# Patient Record
Sex: Male | Born: 1955 | Race: Black or African American | Hispanic: No | Marital: Single | Smoking: Former smoker
Health system: Southern US, Community
[De-identification: ages and names within clinical notes are randomized; demographics above are authoritative.]

## PROBLEM LIST (undated history)

## (undated) DIAGNOSIS — G43909 Migraine, unspecified, not intractable, without status migrainosus: Secondary | ICD-10-CM

## (undated) DIAGNOSIS — E663 Overweight: Secondary | ICD-10-CM

## (undated) DIAGNOSIS — N400 Enlarged prostate without lower urinary tract symptoms: Secondary | ICD-10-CM

## (undated) HISTORY — PX: BUNIONECTOMY: SHX129

## (undated) HISTORY — DX: Migraine, unspecified, not intractable, without status migrainosus: G43.909

## (undated) HISTORY — DX: Overweight: E66.3

## (undated) HISTORY — PX: OTHER SURGICAL HISTORY: SHX169

## (undated) HISTORY — PX: TONSILLECTOMY: SUR1361

---

## 2002-05-07 ENCOUNTER — Encounter: Payer: Self-pay | Admitting: Cardiology

## 2002-05-07 ENCOUNTER — Encounter: Admission: RE | Admit: 2002-05-07 | Discharge: 2002-05-07 | Payer: Self-pay | Admitting: Cardiology

## 2004-04-16 ENCOUNTER — Emergency Department: Payer: Self-pay | Admitting: Emergency Medicine

## 2004-07-07 ENCOUNTER — Ambulatory Visit (HOSPITAL_COMMUNITY): Admission: RE | Admit: 2004-07-07 | Discharge: 2004-07-07 | Payer: Self-pay | Admitting: Cardiology

## 2004-07-17 ENCOUNTER — Encounter: Admission: RE | Admit: 2004-07-17 | Discharge: 2004-07-17 | Payer: Self-pay | Admitting: Cardiology

## 2005-11-06 ENCOUNTER — Encounter: Admission: RE | Admit: 2005-11-06 | Discharge: 2005-11-06 | Payer: Self-pay | Admitting: Cardiology

## 2007-09-30 ENCOUNTER — Encounter: Admission: RE | Admit: 2007-09-30 | Discharge: 2007-09-30 | Payer: Self-pay | Admitting: Cardiology

## 2007-11-02 ENCOUNTER — Emergency Department: Payer: Self-pay | Admitting: Emergency Medicine

## 2009-08-30 ENCOUNTER — Ambulatory Visit (HOSPITAL_BASED_OUTPATIENT_CLINIC_OR_DEPARTMENT_OTHER): Admission: RE | Admit: 2009-08-30 | Discharge: 2009-08-30 | Payer: Self-pay | Admitting: Cardiology

## 2009-09-03 ENCOUNTER — Ambulatory Visit: Payer: Self-pay | Admitting: Internal Medicine

## 2011-08-17 ENCOUNTER — Inpatient Hospital Stay: Payer: Self-pay | Admitting: *Deleted

## 2011-08-17 LAB — COMPREHENSIVE METABOLIC PANEL
Albumin: 4.4 g/dL (ref 3.4–5.0)
BUN: 15 mg/dL (ref 7–18)
Bilirubin,Total: 0.7 mg/dL (ref 0.2–1.0)
Calcium, Total: 9.3 mg/dL (ref 8.5–10.1)
Co2: 23 mmol/L (ref 21–32)
SGPT (ALT): 41 U/L
Total Protein: 8.1 g/dL (ref 6.4–8.2)

## 2011-08-17 LAB — URINALYSIS, COMPLETE
Bilirubin,UR: NEGATIVE
Glucose,UR: NEGATIVE mg/dL (ref 0–75)
Ketone: NEGATIVE
Nitrite: NEGATIVE
Specific Gravity: 1.006 (ref 1.003–1.030)
WBC UR: <1 /HPF (ref 0–5)

## 2011-08-17 LAB — CBC WITH DIFFERENTIAL/PLATELET
Basophil #: 0 10*3/uL (ref 0.0–0.1)
Eosinophil %: 0.2 %
HCT: 43.5 % (ref 40.0–52.0)
Lymphocyte #: 0.5 10*3/uL — ABNORMAL LOW (ref 1.0–3.6)
Lymphocyte %: 4.6 %
MCH: 30.2 pg (ref 26.0–34.0)
MCHC: 33.6 g/dL (ref 32.0–36.0)
Neutrophil #: 9.5 10*3/uL — ABNORMAL HIGH (ref 1.4–6.5)
Neutrophil %: 87.3 %
Platelet: 178 10*3/uL (ref 150–440)
RDW: 14.2 % (ref 11.5–14.5)

## 2011-08-17 LAB — RAPID INFLUENZA A&B ANTIGENS

## 2011-08-18 LAB — COMPREHENSIVE METABOLIC PANEL
Albumin: 2.9 g/dL — ABNORMAL LOW (ref 3.4–5.0)
Alkaline Phosphatase: 56 U/L (ref 50–136)
Anion Gap: 11 (ref 7–16)
BUN: 13 mg/dL (ref 7–18)
Bilirubin,Total: 0.3 mg/dL (ref 0.2–1.0)
Calcium, Total: 7.8 mg/dL — ABNORMAL LOW (ref 8.5–10.1)
Co2: 21 mmol/L (ref 21–32)
Creatinine: 1.22 mg/dL (ref 0.60–1.30)
EGFR (African American): >60
Glucose: 86 mg/dL (ref 65–99)
Potassium: 3.5 mmol/L (ref 3.5–5.1)
SGOT(AST): 18 U/L (ref 15–37)
SGPT (ALT): 25 U/L
Sodium: 142 mmol/L (ref 136–145)
Total Protein: 6.2 g/dL — ABNORMAL LOW (ref 6.4–8.2)

## 2011-08-18 LAB — CBC WITH DIFFERENTIAL/PLATELET
Basophil #: 0 10*3/uL (ref 0.0–0.1)
Eosinophil #: 0 10*3/uL (ref 0.0–0.7)
Eosinophil %: 0.8 %
HCT: 36.9 % — ABNORMAL LOW (ref 40.0–52.0)
HGB: 12.4 g/dL — ABNORMAL LOW (ref 13.0–18.0)
Lymphocyte #: 0.8 10*3/uL — ABNORMAL LOW (ref 1.0–3.6)
Lymphocyte %: 12.6 %
MCH: 30.3 pg (ref 26.0–34.0)
Monocyte #: 0.7 10*3/uL (ref 0.0–0.7)
Monocyte %: 10.6 %
Platelet: 126 10*3/uL — ABNORMAL LOW (ref 150–440)
WBC: 6.4 10*3/uL (ref 3.8–10.6)

## 2011-08-22 LAB — CULTURE, BLOOD (SINGLE)

## 2011-08-24 LAB — CULTURE, BLOOD (SINGLE)

## 2012-02-25 ENCOUNTER — Other Ambulatory Visit: Payer: BC Managed Care – PPO | Admitting: Internal Medicine

## 2012-02-25 ENCOUNTER — Other Ambulatory Visit: Payer: Self-pay | Admitting: Internal Medicine

## 2012-02-25 DIAGNOSIS — Z0000 Encounter for general adult medical examination without abnormal findings: Secondary | ICD-10-CM

## 2012-02-25 DIAGNOSIS — Z125 Encounter for screening for malignant neoplasm of prostate: Secondary | ICD-10-CM

## 2012-02-25 LAB — CBC WITH DIFFERENTIAL/PLATELET
Basophils Absolute: 0 10*3/uL (ref 0.0–0.1)
Basophils Relative: 0 % (ref 0–1)
Eosinophils Absolute: 0.2 10*3/uL (ref 0.0–0.7)
Eosinophils Relative: 6 % — ABNORMAL HIGH (ref 0–5)
HCT: 39.7 % (ref 39.0–52.0)
Hemoglobin: 13.9 g/dL (ref 13.0–17.0)
Lymphocytes Relative: 37 % (ref 12–46)
Lymphs Abs: 1 10*3/uL (ref 0.7–4.0)
MCH: 30.2 pg (ref 26.0–34.0)
MCHC: 35 g/dL (ref 30.0–36.0)
MCV: 86.3 fL (ref 78.0–100.0)
Monocytes Absolute: 0.4 10*3/uL (ref 0.1–1.0)
Monocytes Relative: 13 % — ABNORMAL HIGH (ref 3–12)
Neutro Abs: 1.2 10*3/uL — ABNORMAL LOW (ref 1.7–7.7)
Neutrophils Relative %: 44 % (ref 43–77)
Platelets: 179 10*3/uL (ref 150–400)
RBC: 4.6 MIL/uL (ref 4.22–5.81)
RDW: 14 % (ref 11.5–15.5)
WBC: 2.8 10*3/uL — ABNORMAL LOW (ref 4.0–10.5)

## 2012-02-25 LAB — LIPID PANEL
Cholesterol: 196 mg/dL (ref 0–200)
HDL: 43 mg/dL (ref >39)
LDL Cholesterol: 141 mg/dL — ABNORMAL HIGH (ref 0–99)
Total CHOL/HDL Ratio: 4.6 Ratio
Triglycerides: 61 mg/dL (ref <150)
VLDL: 12 mg/dL (ref 0–40)

## 2012-02-25 LAB — COMPREHENSIVE METABOLIC PANEL
ALT: 22 U/L (ref 0–53)
AST: 22 U/L (ref 0–37)
Albumin: 4.2 g/dL (ref 3.5–5.2)
Alkaline Phosphatase: 51 U/L (ref 39–117)
BUN: 13 mg/dL (ref 6–23)
CO2: 26 mEq/L (ref 19–32)
Calcium: 9 mg/dL (ref 8.4–10.5)
Chloride: 104 mEq/L (ref 96–112)
Creat: 0.99 mg/dL (ref 0.50–1.35)
Glucose, Bld: 79 mg/dL (ref 70–99)
Potassium: 4.2 mEq/L (ref 3.5–5.3)
Sodium: 137 mEq/L (ref 135–145)
Total Bilirubin: 0.6 mg/dL (ref 0.3–1.2)
Total Protein: 6.6 g/dL (ref 6.0–8.3)

## 2012-02-26 ENCOUNTER — Encounter: Payer: Self-pay | Admitting: Internal Medicine

## 2012-02-26 ENCOUNTER — Ambulatory Visit (INDEPENDENT_AMBULATORY_CARE_PROVIDER_SITE_OTHER): Payer: BC Managed Care – PPO | Admitting: Internal Medicine

## 2012-02-26 VITALS — BP 120/68 | HR 80 | Temp 97.9°F | Ht 67.0 in | Wt 249.0 lb

## 2012-02-26 DIAGNOSIS — E785 Hyperlipidemia, unspecified: Secondary | ICD-10-CM

## 2012-02-26 DIAGNOSIS — Z0000 Encounter for general adult medical examination without abnormal findings: Secondary | ICD-10-CM

## 2012-02-26 DIAGNOSIS — Z8669 Personal history of other diseases of the nervous system and sense organs: Secondary | ICD-10-CM

## 2012-02-26 DIAGNOSIS — N400 Enlarged prostate without lower urinary tract symptoms: Secondary | ICD-10-CM

## 2012-02-26 DIAGNOSIS — Z23 Encounter for immunization: Secondary | ICD-10-CM

## 2012-02-26 LAB — POCT URINALYSIS DIPSTICK
Blood, UA: NEGATIVE
Glucose, UA: NEGATIVE
Nitrite, UA: NEGATIVE
Protein, UA: NEGATIVE
Spec Grav, UA: 1.02
Urobilinogen, UA: NEGATIVE

## 2012-02-26 LAB — PSA: PSA: 1.02 ng/mL (ref <=4.00)

## 2012-02-26 MED ORDER — TETANUS-DIPHTH-ACELL PERTUSSIS 5-2.5-18.5 LF-MCG/0.5 IM SUSP: 0.5000 mL | Freq: Once | INTRAMUSCULAR | Status: DC

## 2012-02-27 LAB — HIV ANTIBODY (ROUTINE TESTING W REFLEX): HIV: NONREACTIVE

## 2012-03-16 ENCOUNTER — Encounter: Payer: Self-pay | Admitting: Internal Medicine

## 2012-04-18 DIAGNOSIS — N400 Enlarged prostate without lower urinary tract symptoms: Secondary | ICD-10-CM | POA: Insufficient documentation

## 2012-04-18 DIAGNOSIS — Z8669 Personal history of other diseases of the nervous system and sense organs: Secondary | ICD-10-CM | POA: Insufficient documentation

## 2012-04-18 DIAGNOSIS — E785 Hyperlipidemia, unspecified: Secondary | ICD-10-CM | POA: Insufficient documentation

## 2012-04-18 NOTE — Patient Instructions (Addendum)
Watch diet and continue exercise. Repeat lipid panel in 6-12 months. Take Vicodin if migraine headache not relieved by Excedrin Migraine

## 2012-04-18 NOTE — Progress Notes (Signed)
  Subjective:    Patient ID: Nicholas Flores, male    DOB: [birth year], 56 y.o.   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 History of migraine headaches for which he takes Excedrin Migraine. He has no rescue medication at the present time other than Excedrin Migraine.  History of right bunionectomy in the mid 1990s by Dr. Charlsie Merles.  Is allergic to penicillin.  History irritable bowel syndrome and lactose intolerance.  Sees Dr. Brunilda Payor for BPH  Patient is single. Completed 4 years of college. Smokes an occasional cigar. He used to smoke a half pack of cigarettes daily for some 12 years but quit 12 years ago. Drinks red wine daily. Generally has 8 ounces a day and has done this for 20 years. He also works out in a jam 2 or 3 times a week. Does cardio activity and weight lifting.  Family history: Twin brother age 77 in good health. Mother age 74 with history of stroke.    Review of Systems  Constitutional: Negative.   HENT: Negative.   Eyes: Negative.   Respiratory: Negative.   Cardiovascular: Negative.   Gastrointestinal: Negative.   Genitourinary:       Nocturia  Musculoskeletal: Negative.   Neurological:       History of migraine headaches  Hematological: Negative.   Psychiatric/Behavioral: Negative.        Objective:   Physical Exam  Vitals reviewed. Constitutional: He is oriented to person, place, and time. He appears well-developed and well-nourished. No distress.  HENT:  Head: Normocephalic and atraumatic.  Right Ear: External ear normal.  Mouth/Throat: Oropharynx is clear and moist. No oropharyngeal exudate.  Eyes: Conjunctivae normal and EOM are normal. Pupils are equal, round, and reactive to light. Left eye exhibits no discharge. No scleral icterus.  Neck: Neck supple. No JVD present. No thyromegaly present.  Cardiovascular: Normal rate, regular rhythm, normal heart  sounds and intact distal pulses.   No murmur heard. Pulmonary/Chest: Effort normal and breath sounds normal. He has no wheezes. He has no rales.  Abdominal: Soft. Bowel sounds are normal. He exhibits no distension and no mass. There is no tenderness. There is no rebound and no guarding.  Genitourinary:       Deferred to urologist  Musculoskeletal: Normal range of motion. He exhibits no edema.  Lymphadenopathy:    He has no cervical adenopathy.  Neurological: He is alert and oriented to person, place, and time. He has normal reflexes. No cranial nerve deficit. Coordination normal.  Skin: Skin is warm and dry. No rash noted. He is not diaphoretic.  Psychiatric: He has a normal mood and affect. His behavior is normal. Judgment and thought content normal.          Assessment & Plan:   BPH-treated with Flomax  History of migraine headaches-treated with Excedrin Migraine  Elevated LDL cholesterol  Plan: Patient will be prescribed Vicodin 5/500 (#60) 1 by mouth every 6 hours when necessary headache not relieved with Excedrin Migraine. Patient is to return in one year or as needed.  Patient is to watch diet continue exercise regimen.

## 2012-08-26 ENCOUNTER — Other Ambulatory Visit: Payer: BC Managed Care – PPO | Admitting: Internal Medicine

## 2012-08-26 DIAGNOSIS — E785 Hyperlipidemia, unspecified: Secondary | ICD-10-CM

## 2012-08-26 LAB — LIPID PANEL
Cholesterol: 204 mg/dL — ABNORMAL HIGH (ref 0–200)
Total CHOL/HDL Ratio: 4.3 Ratio
VLDL: 10 mg/dL (ref 0–40)

## 2012-08-28 ENCOUNTER — Other Ambulatory Visit: Payer: BC Managed Care – PPO | Admitting: Internal Medicine

## 2012-08-29 ENCOUNTER — Ambulatory Visit (INDEPENDENT_AMBULATORY_CARE_PROVIDER_SITE_OTHER): Payer: BC Managed Care – PPO | Admitting: Internal Medicine

## 2012-08-29 ENCOUNTER — Encounter: Payer: Self-pay | Admitting: Internal Medicine

## 2012-08-29 VITALS — BP 120/82 | HR 80 | Temp 97.8°F | Wt 250.0 lb

## 2012-08-29 DIAGNOSIS — E785 Hyperlipidemia, unspecified: Secondary | ICD-10-CM

## 2012-09-14 NOTE — Patient Instructions (Addendum)
Continue low-fat diet. Try to exercise several times weekly. Return in 6 months.

## 2012-09-14 NOTE — Progress Notes (Signed)
  Subjective:    Patient ID: Nicholas Flores, male    DOB: 06-13-1955, 57 y.o.   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 Hyperlipidemia has been diet controlled. He drinks a couple glasses of red wine at night. He eats fairly well but doesn't get exercise very much. School is been very hectic and stressful do 2 days Ms. because of bad weather.    Review of Systems     Objective:   Physical Exam Neck is supple without JVD thyromegaly or carotid bruits. Chest clear to auscultation. Cardiac exam regular rate and rhythm normal S1 and S2. Extremities without edema.  Lipid panel shows total cholesterol to be 204 and previously was 196. LDL cholesterol is 146 and previously was 141.       Assessment & Plan:  Hyperlipidemia-no significant change in lipid panel since last visit.   Plan: Patient agrees to begin regular exercise regimen and continue low-fat diet. Return in 6 months for physical exam and fasting lab work.

## 2012-10-24 ENCOUNTER — Ambulatory Visit: Payer: BC Managed Care – PPO | Admitting: Internal Medicine

## 2013-03-02 ENCOUNTER — Other Ambulatory Visit: Payer: BC Managed Care – PPO | Admitting: Internal Medicine

## 2013-03-05 ENCOUNTER — Other Ambulatory Visit: Payer: BC Managed Care – PPO | Admitting: Internal Medicine

## 2013-03-05 DIAGNOSIS — N401 Enlarged prostate with lower urinary tract symptoms: Secondary | ICD-10-CM

## 2013-03-05 DIAGNOSIS — Z114 Encounter for screening for human immunodeficiency virus [HIV]: Secondary | ICD-10-CM

## 2013-03-05 DIAGNOSIS — Z0000 Encounter for general adult medical examination without abnormal findings: Secondary | ICD-10-CM

## 2013-03-05 DIAGNOSIS — E785 Hyperlipidemia, unspecified: Secondary | ICD-10-CM

## 2013-03-05 DIAGNOSIS — Z130 Encounter for screening for diseases of the blood and blood-forming organs and certain disorders involving the immune mechanism: Secondary | ICD-10-CM

## 2013-03-05 LAB — COMPREHENSIVE METABOLIC PANEL
ALT: 24 U/L (ref 0–53)
AST: 25 U/L (ref 0–37)
Albumin: 4.1 g/dL (ref 3.5–5.2)
Alkaline Phosphatase: 53 U/L (ref 39–117)
BUN: 14 mg/dL (ref 6–23)
Calcium: 9.5 mg/dL (ref 8.4–10.5)
Chloride: 105 mEq/L (ref 96–112)
Potassium: 4.4 mEq/L (ref 3.5–5.3)
Sodium: 139 mEq/L (ref 135–145)
Total Protein: 6.8 g/dL (ref 6.0–8.3)

## 2013-03-05 LAB — CBC WITH DIFFERENTIAL/PLATELET
Basophils Absolute: 0 10*3/uL (ref 0.0–0.1)
Basophils Relative: 1 % (ref 0–1)
Eosinophils Absolute: 0.2 10*3/uL (ref 0.0–0.7)
MCH: 30.7 pg (ref 26.0–34.0)
MCHC: 34.6 g/dL (ref 30.0–36.0)
Monocytes Relative: 12 % (ref 3–12)
Neutro Abs: 2 10*3/uL (ref 1.7–7.7)
Neutrophils Relative %: 54 % (ref 43–77)
Platelets: 183 10*3/uL (ref 150–400)
RDW: 14.3 % (ref 11.5–15.5)

## 2013-03-05 LAB — LIPID PANEL
Cholesterol: 194 mg/dL (ref 0–200)
Triglycerides: 55 mg/dL (ref <150)
VLDL: 11 mg/dL (ref 0–40)

## 2013-03-05 LAB — PSA: PSA: 1.27 ng/mL (ref <=4.00)

## 2013-03-06 ENCOUNTER — Encounter: Payer: Self-pay | Admitting: Internal Medicine

## 2013-03-06 ENCOUNTER — Ambulatory Visit (INDEPENDENT_AMBULATORY_CARE_PROVIDER_SITE_OTHER): Payer: BC Managed Care – PPO | Admitting: Internal Medicine

## 2013-03-06 VITALS — BP 110/76 | HR 88 | Temp 97.6°F | Ht 66.5 in | Wt 234.0 lb

## 2013-03-06 DIAGNOSIS — N400 Enlarged prostate without lower urinary tract symptoms: Secondary | ICD-10-CM

## 2013-03-06 DIAGNOSIS — Z8669 Personal history of other diseases of the nervous system and sense organs: Secondary | ICD-10-CM

## 2013-03-06 DIAGNOSIS — E7800 Pure hypercholesterolemia, unspecified: Secondary | ICD-10-CM

## 2013-03-06 DIAGNOSIS — Z0000 Encounter for general adult medical examination without abnormal findings: Secondary | ICD-10-CM

## 2013-03-06 LAB — POCT URINALYSIS DIPSTICK
Bilirubin, UA: NEGATIVE
Blood, UA: NEGATIVE
Glucose, UA: NEGATIVE
Nitrite, UA: NEGATIVE
Protein, UA: NEGATIVE
Spec Grav, UA: 1.015
Urobilinogen, UA: NEGATIVE
pH, UA: 5.5

## 2013-03-06 MED ORDER — MOMETASONE FUROATE 50 MCG/ACT NA SUSP: 2.0000 | Freq: Every day | NASAL | Status: DC | PRN

## 2013-03-06 MED ORDER — HYDROCODONE-ACETAMINOPHEN 5-500 MG PO TABS: 1.0000 | ORAL_TABLET | Freq: Four times a day (QID) | ORAL | Status: DC | PRN

## 2013-03-06 NOTE — Patient Instructions (Signed)
Congratulations on weight loss. Keep up the good work. Return in one year or as needed.

## 2013-07-16 NOTE — Progress Notes (Signed)
   Subjective:    Patient ID: Nicholas Flores, male    DOB: [birth year], 58 y.o.   MRN: [id number]  HPI 58 year old Black Male in today for health maintenance exam and evaluation of medical issues. He had influenza immunization through employment. He has history of BPH with nocturia treated with generic Flomax. History of migraine headaches for which he takes Excedrin Migraine. History of hyperlipidemia.  He is allergic to penicillin.  Past medical history: History of right bunionectomy in the mid 1990s by Dr. Dellia Nimsiegel. History of variable bowel syndrome and lactose intolerance. Sees Dr. Brunilda PayorNesi for BPH.  Social history: Patient is single. He completed 4 years of college. Smokes an occasional cigar. He used to smoke a half pack of cigarettes daily for some 12 years but quit 1 13 years ago. Drinks red wine daily. Generally has 8 ounces a day and has done this for 20 years. Tries to work out several times a week. 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  Family history: Mother living in her 4380s with history of stroke. Twin brother in good health.    Review of Systems  Constitutional: Negative.   Endocrine:       History of hyperlipidemia  Neurological:       History of migraine headaches  All other systems reviewed and are negative.       Objective:   Physical Exam  Vitals reviewed. Constitutional: He is oriented to person, place, and time. He appears well-developed and well-nourished.  HENT:  Head: Normocephalic and atraumatic.  Right Ear: External ear normal.  Left Ear: External ear normal.  Mouth/Throat: Oropharynx is clear and moist. No oropharyngeal exudate.  Eyes: Conjunctivae and EOM are normal. Right eye exhibits no discharge. Left eye exhibits no discharge. No scleral icterus.  Neck: Neck supple. No JVD present. No thyromegaly present.  Cardiovascular: Normal rate, regular rhythm, normal heart sounds and intact distal pulses.   No murmur  heard. Pulmonary/Chest: Effort normal and breath sounds normal. No respiratory distress. He has no wheezes. He has no rales. He exhibits no tenderness.  Abdominal: Soft. Bowel sounds are normal. He exhibits no distension and no mass. There is no tenderness. There is no rebound and no guarding.  Genitourinary:  Per Dr. Brunilda PayorNesi  Musculoskeletal: He exhibits no edema.  Lymphadenopathy:    He has no cervical adenopathy.  Neurological: He is alert and oriented to person, place, and time. He has normal reflexes. No cranial nerve deficit. Coordination normal.  Skin: Skin is warm and dry. No rash noted.  Psychiatric: He has a normal mood and affect. His behavior is normal. Judgment and thought content normal.          Assessment & Plan:  BPH treated with Flomax per Dr. Brunilda PayorNesi  History of migraine headaches- treated with Excedrin Migraine  History of elevated LDL cholesterol-improved with diet and exercise. LDL cholesterol has decreased from 146-133. Continue to monitor.  Plan: Return in one year or as needed. Continue diet and exercise efforts.

## 2013-09-18 ENCOUNTER — Ambulatory Visit (INDEPENDENT_AMBULATORY_CARE_PROVIDER_SITE_OTHER): Payer: BC Managed Care – PPO | Admitting: Podiatry

## 2013-09-18 ENCOUNTER — Encounter: Payer: Self-pay | Admitting: Podiatry

## 2013-09-18 VITALS — BP 116/82 | HR 68 | Resp 18 | Ht 69.0 in | Wt 237.0 lb

## 2013-09-18 DIAGNOSIS — B351 Tinea unguium: Secondary | ICD-10-CM

## 2013-09-18 DIAGNOSIS — M79609 Pain in unspecified limb: Secondary | ICD-10-CM

## 2013-09-18 LAB — CBC WITH DIFFERENTIAL/PLATELET
BASOS ABS: 0 10*3/uL (ref 0.0–0.2)
Basos: 1 %
EOS: 4 %
Eosinophils Absolute: 0.1 10*3/uL (ref 0.0–0.4)
HEMATOCRIT: 41.1 % (ref 37.5–51.0)
Hemoglobin: 14.4 g/dL (ref 12.6–17.7)
IMMATURE GRANS (ABS): 0 10*3/uL (ref 0.0–0.1)
IMMATURE GRANULOCYTES: 0 %
Lymphocytes Absolute: 1.4 10*3/uL (ref 0.7–3.1)
Lymphs: 38 %
MCH: 30 pg (ref 26.6–33.0)
MCHC: 35 g/dL (ref 31.5–35.7)
MCV: 86 fL (ref 79–97)
MONOCYTES: 14 %
Monocytes Absolute: 0.5 10*3/uL (ref 0.1–0.9)
NEUTROS PCT: 43 %
Neutrophils Absolute: 1.7 10*3/uL (ref 1.4–7.0)
RBC: 4.8 x10E6/uL (ref 4.14–5.80)
RDW: 13.5 % (ref 12.3–15.4)
WBC: 3.7 10*3/uL (ref 3.4–10.8)

## 2013-09-18 MED ORDER — TERBINAFINE HCL 250 MG PO TABS: 250.0000 mg | ORAL_TABLET | Freq: Every day | ORAL | Status: DC

## 2013-09-18 NOTE — Progress Notes (Signed)
Subjective:     Patient ID: Nicholas Flores, male   DOB: 01-11-1956, 58 y.o.   MRN: [id number]  HPI patient presents stating I have thickness of all of my nails and I'm hoping something can be done for this. States it's been present for a number of years but it seems to have gotten worse recently   Review of Systems  All other systems reviewed and are negative.      Objective:   Physical Exam  Nursing note and vitals reviewed. Constitutional: He is oriented to person, place, and time.  Cardiovascular: Intact distal pulses.   Musculoskeletal: Normal range of motion.  Neurological: He is oriented to person, place, and time.  Skin: Skin is warm.   neurovascular status intact with muscle strength adequate and no equinus condition noted. 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 He wants aggressive conservative care and I have recommended a combined approach of Lamisil 250 mg for 90 days topical formulas 3 and laser here it patient wants this treatment and is scheduled for laser and we'll begin oral Lamisil and was sent for liver function study

## 2013-09-18 NOTE — Progress Notes (Signed)
   Subjective:    Patient ID: Nicholas Flores, male    DOB: [birth year], 58 y.o.   MRN: [id number]  HPI Comments: Would like to see about possible laser treatment . Thick discolored toenails on all the toenails. They have been like this for a while      Review of Systems  All other systems reviewed and are negative.      Objective:   Physical Exam        Assessment & Plan:

## 2013-09-19 LAB — HEPATIC FUNCTION PANEL
ALT: 28 IU/L (ref 0–44)
AST: 23 IU/L (ref 0–40)
Albumin: 4.5 g/dL (ref 3.5–5.5)
Alkaline Phosphatase: 56 IU/L (ref 39–117)
BILIRUBIN TOTAL: 0.4 mg/dL (ref 0.0–1.2)
Bilirubin, Direct: 0.13 mg/dL (ref 0.00–0.40)
TOTAL PROTEIN: 7.1 g/dL (ref 6.0–8.5)

## 2013-09-21 ENCOUNTER — Other Ambulatory Visit: Payer: Self-pay | Admitting: *Deleted

## 2013-09-21 MED ORDER — TERBINAFINE HCL 250 MG PO TABS
250.0000 mg | ORAL_TABLET | Freq: Every day | ORAL | Status: DC
Start: 2013-09-21 — End: 2014-04-23

## 2013-09-21 NOTE — Telephone Encounter (Signed)
SENT RX FOR LAMISIL TO [facility] [address]. PT HAD [facility] [address] LISTED TO SEND THIS RX TO. REQUEST PRIOR AUTH. PT AWARE OF THIS.

## 2013-09-28 ENCOUNTER — Ambulatory Visit: Payer: BC Managed Care – PPO | Admitting: Podiatry

## 2013-09-28 ENCOUNTER — Encounter: Payer: Self-pay | Admitting: Podiatry

## 2013-09-28 VITALS — BP 124/77 | HR 77 | Resp 16

## 2013-09-28 DIAGNOSIS — B351 Tinea unguium: Secondary | ICD-10-CM

## 2013-09-28 NOTE — Patient Instructions (Signed)

## 2013-09-28 NOTE — Progress Notes (Signed)
Subjective:     Patient ID: Nicholas Flores, male   DOB: Aug 29, 1955, 58 y.o.   MRN: [id number]  HPI patient presents for laser of all 10 toenails having chronic mycotic disease for years   Review of Systems     Objective:   Physical Exam Neurovascular status intact with thick brittle nailbeds 1-5 both feet    Assessment:     Mycotic nail infection with pain 1-5 both feet    Plan:     Laser of all 10 toenails approximately 2500 shocks performed

## 2013-11-16 ENCOUNTER — Ambulatory Visit: Payer: BC Managed Care – PPO | Admitting: Podiatry

## 2013-11-16 ENCOUNTER — Encounter: Payer: Self-pay | Admitting: Podiatry

## 2013-11-16 DIAGNOSIS — B351 Tinea unguium: Secondary | ICD-10-CM

## 2013-11-16 NOTE — Progress Notes (Signed)
Subjective:     Patient ID: Nicholas Flores, male   DOB: 03/03/1956, 58 y.o.   MRN: [id number]  HPI patient presents for laser #2 states they're improving   Review of Systems     Objective:   Physical Exam Neurovascular status intact with improving nailbeds 1-5 of both feet    Assessment:     Improving mycosis of both feet    Plan:     Laser approximately 2000 pulses applied to nails one through 5 of both feet

## 2014-03-05 ENCOUNTER — Other Ambulatory Visit: Payer: BC Managed Care – PPO | Admitting: Internal Medicine

## 2014-03-08 ENCOUNTER — Encounter: Payer: BC Managed Care – PPO | Admitting: Internal Medicine

## 2014-03-25 ENCOUNTER — Other Ambulatory Visit: Payer: BC Managed Care – PPO | Admitting: Internal Medicine

## 2014-03-26 ENCOUNTER — Encounter: Payer: BC Managed Care – PPO | Admitting: Internal Medicine

## 2014-04-19 ENCOUNTER — Ambulatory Visit: Payer: BC Managed Care – PPO | Admitting: Podiatry

## 2014-04-22 ENCOUNTER — Other Ambulatory Visit: Payer: BC Managed Care – PPO | Admitting: Internal Medicine

## 2014-04-22 DIAGNOSIS — Z130 Encounter for screening for diseases of the blood and blood-forming organs and certain disorders involving the immune mechanism: Secondary | ICD-10-CM

## 2014-04-22 DIAGNOSIS — Z0000 Encounter for general adult medical examination without abnormal findings: Secondary | ICD-10-CM

## 2014-04-22 DIAGNOSIS — Z125 Encounter for screening for malignant neoplasm of prostate: Secondary | ICD-10-CM

## 2014-04-22 DIAGNOSIS — Z13220 Encounter for screening for lipoid disorders: Secondary | ICD-10-CM

## 2014-04-22 LAB — CBC WITH DIFFERENTIAL/PLATELET
Basophils Absolute: 0 10*3/uL (ref 0.0–0.1)
Basophils Relative: 1 % (ref 0–1)
Eosinophils Absolute: 0.2 10*3/uL (ref 0.0–0.7)
Eosinophils Relative: 4 % (ref 0–5)
HCT: 39.8 % (ref 39.0–52.0)
Hemoglobin: 13.6 g/dL (ref 13.0–17.0)
Lymphocytes Relative: 31 % (ref 12–46)
Lymphs Abs: 1.3 10*3/uL (ref 0.7–4.0)
MCH: 29.4 pg (ref 26.0–34.0)
MCHC: 34.2 g/dL (ref 30.0–36.0)
MCV: 86 fL (ref 78.0–100.0)
MPV: 9.1 fL — ABNORMAL LOW (ref 9.4–12.4)
Monocytes Absolute: 0.5 10*3/uL (ref 0.1–1.0)
Monocytes Relative: 12 % (ref 3–12)
Neutro Abs: 2.2 10*3/uL (ref 1.7–7.7)
Neutrophils Relative %: 52 % (ref 43–77)
Platelets: 200 10*3/uL (ref 150–400)
RBC: 4.63 MIL/uL (ref 4.22–5.81)
RDW: 14.6 % (ref 11.5–15.5)
WBC: 4.3 10*3/uL (ref 4.0–10.5)

## 2014-04-22 LAB — LIPID PANEL
CHOLESTEROL: 213 mg/dL — AB (ref 0–200)
HDL: 55 mg/dL (ref >39)
LDL Cholesterol: 145 mg/dL — ABNORMAL HIGH (ref 0–99)
Total CHOL/HDL Ratio: 3.9 Ratio
Triglycerides: 63 mg/dL (ref <150)
VLDL: 13 mg/dL (ref 0–40)

## 2014-04-22 LAB — COMPREHENSIVE METABOLIC PANEL WITH GFR
ALT: 37 U/L (ref 0–53)
AST: 26 U/L (ref 0–37)
Albumin: 4 g/dL (ref 3.5–5.2)
Alkaline Phosphatase: 61 U/L (ref 39–117)
BUN: 15 mg/dL (ref 6–23)
CO2: 26 meq/L (ref 19–32)
Calcium: 9.3 mg/dL (ref 8.4–10.5)
Chloride: 104 meq/L (ref 96–112)
Creat: 1.2 mg/dL (ref 0.50–1.35)
Glucose, Bld: 91 mg/dL (ref 70–99)
Potassium: 4.6 meq/L (ref 3.5–5.3)
Sodium: 139 meq/L (ref 135–145)
Total Bilirubin: 0.4 mg/dL (ref 0.2–1.2)
Total Protein: 7.1 g/dL (ref 6.0–8.3)

## 2014-04-23 ENCOUNTER — Ambulatory Visit (INDEPENDENT_AMBULATORY_CARE_PROVIDER_SITE_OTHER): Payer: BC Managed Care – PPO | Admitting: Internal Medicine

## 2014-04-23 ENCOUNTER — Encounter: Payer: Self-pay | Admitting: Internal Medicine

## 2014-04-23 VITALS — BP 104/78 | HR 91 | Temp 98.6°F | Ht 69.0 in | Wt 263.0 lb

## 2014-04-23 DIAGNOSIS — N400 Enlarged prostate without lower urinary tract symptoms: Secondary | ICD-10-CM | POA: Diagnosis not present

## 2014-04-23 DIAGNOSIS — Z8669 Personal history of other diseases of the nervous system and sense organs: Secondary | ICD-10-CM

## 2014-04-23 DIAGNOSIS — Z0000 Encounter for general adult medical examination without abnormal findings: Secondary | ICD-10-CM

## 2014-04-23 DIAGNOSIS — E785 Hyperlipidemia, unspecified: Secondary | ICD-10-CM

## 2014-04-23 DIAGNOSIS — M2390 Unspecified internal derangement of unspecified knee: Secondary | ICD-10-CM | POA: Diagnosis not present

## 2014-04-23 LAB — POCT URINALYSIS DIPSTICK
Glucose, UA: NEGATIVE
KETONES UA: NEGATIVE
LEUKOCYTES UA: NEGATIVE
Nitrite, UA: NEGATIVE
PROTEIN UA: NEGATIVE
SPEC GRAV UA: 1.015
Urobilinogen, UA: 0.2
pH, UA: 6

## 2014-04-23 LAB — PSA: PSA: 1.09 ng/mL (ref <=4.00)

## 2014-04-26 ENCOUNTER — Ambulatory Visit (INDEPENDENT_AMBULATORY_CARE_PROVIDER_SITE_OTHER): Payer: BC Managed Care – PPO | Admitting: Podiatry

## 2014-04-26 ENCOUNTER — Encounter: Payer: Self-pay | Admitting: Podiatry

## 2014-04-26 DIAGNOSIS — B351 Tinea unguium: Secondary | ICD-10-CM

## 2014-04-26 NOTE — Progress Notes (Signed)
Subjective:     Patient ID: Nicholas Flores, male   DOB: Mar 02, 1956, 58 y.o.   MRN: [id number]  HPI patient presents stating I'm here to have my nails lasered they are improving   Review of Systems     Objective:   Physical Exam Neurovascular status intact with improvement of nailbeds 1-5 both feet with thickness still noted but not as significant as previously    Assessment:     Improving mycotic nail infections 1 through 5 both feet    Plan:     Laser accomplished today to all 10 nails tolerated well approximate 2000 pulses. We will do 30 days of Lamisil in the spring and we'll contact us at that time

## 2014-04-26 NOTE — Patient Instructions (Signed)

## 2014-07-01 DIAGNOSIS — S76119A Strain of unspecified quadriceps muscle, fascia and tendon, initial encounter: Secondary | ICD-10-CM | POA: Insufficient documentation

## 2014-07-18 NOTE — Progress Notes (Signed)
   Subjective:    Patient ID: Nicholas Flores, male    DOB: 12/22/1955, 59 y.o.   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 While at a hair show  in [place] in October, he had a terrible fall in a parking lot and injured his knee. He continues to have knee pain. He has an appointment to be seen in [place] regarding this issue. Receives flu vaccine at work. History of migraine headaches treated with Excedrin Migraine. History of LDL cholesterol. History of BPH treated with Flomax per Dr. Harriett SineNancy.  He is allergic to penicillin.  Past medical history: Right bunionectomy in the mid 1990s by Dr. Charlsie Merlesegal. History of lactose intolerance and irritable bowel syndrome.  Social history: Patient completed 4 years of college. He's single. Smokes an occasional cigar. He used to smoke a half pack of cigarettes daily for some 12 years but quit about 15 years ago. Drinks red wine daily. Generally has 8 ounces a day and is done this for about 20 years. Tries to work out several times a week but has not been able to work out with knee injury.  Family history: Mother living in her 10480s with history of stroke.Twin brother in good health.  Evaluated for chest pain May 2015 at [facility] [place]. See Care Everywhere    Review of Systems  Constitutional: Positive for fatigue.  Cardiovascular: Negative.   Gastrointestinal:       History of lactose intolerance and irritable bowel syndrome  Musculoskeletal:       Knee pain  Psychiatric/Behavioral:       History of migraine headaches       Objective:   Physical Exam  Constitutional: He appears well-developed and well-nourished. No distress.  HENT:  Head: Normocephalic and atraumatic.  Right Ear: External ear normal.  Left Ear: External ear normal.  Mouth/Throat: Oropharynx is clear and moist. No oropharyngeal exudate.  Eyes: Conjunctivae are normal. Pupils are  equal, round, and reactive to light. Right eye exhibits no discharge. Left eye exhibits no discharge. No scleral icterus.  Neck: No JVD present. No thyromegaly present.  Cardiovascular: Normal rate, regular rhythm and normal heart sounds.   No murmur heard. Pulmonary/Chest: Effort normal and breath sounds normal. He has no wheezes.  Abdominal: Soft. Bowel sounds are normal. He exhibits no distension and no mass. There is no tenderness. There is no rebound and no guarding.  Genitourinary: Prostate normal.  Musculoskeletal: He exhibits no edema.  Knee exam: Quadriceps inhibition. Joint line tenderness. Possible meniscal tear.  Lymphadenopathy:    He has no cervical adenopathy.  Skin: Skin is warm and dry. No rash noted. He is not diaphoretic.  Psychiatric: He has a normal mood and affect. His behavior is normal. Thought content normal.  Vitals reviewed.         Assessment & Plan:  Knee pain secondary to serious fall October 2015-possible torn meniscus  History of migraine headaches-treated with Excedrin Migraine  Hyperlipidemia-treated with diet  BPH treated with Flomax per Dr. Brunilda PayorNesi  Plan: [facility] in the near future.

## 2014-07-27 ENCOUNTER — Telehealth: Payer: Self-pay | Admitting: *Deleted

## 2014-07-27 NOTE — Telephone Encounter (Signed)
Patient wnts pain med for migraines he wants something we can call in he states due to recent surgery he cant come in to pick up a script . He does have pain medication from surgeon but its almost out. Explained to patient controlled medications have to be picked up office

## 2014-07-27 NOTE — Telephone Encounter (Signed)
Written Rx for Hydrocodone APAP 5/325 #30 with no refill one po q 8 hours prn migraine headache

## 2014-08-05 NOTE — Telephone Encounter (Signed)
Patient aware script is ready for pick up 

## 2014-08-14 ENCOUNTER — Encounter: Payer: Self-pay | Admitting: Internal Medicine

## 2014-08-14 NOTE — Patient Instructions (Signed)
Keep appointment with orthopedist. Continue same medications. Watch diet. Return in one year or as needed.

## 2014-09-12 NOTE — H&P (Signed)
PATIENT NAME:  Nicholas Flores, Nicholas Flores MR#:  [id number] DATE OF BIRTH:  05/24/1955  DATE OF ADMISSION:  08/17/2011  PRIMARY CARE PHYSICIAN: In [place], [place].   CHIEF COMPLAINT: "I had chills."   HISTORY OF PRESENT ILLNESS: A 59 year old male with history of migraines, history of benign prostatic hypertrophy. He said he went to church last night and he started having chills. He felt cold and he was having severe body aches. He went home. He was also complaining of shortness of breath at that time. He felt as if he is hyperventilating. He was okay throughout the day yesterday. He said he worked a normal day and then it just hit him last night. He denies any nausea, vomiting or abdominal pain. His throat was dry at that time. He is complaining of some headache over the forehead area. He says because he has migraines. He denies any neck stiffness or vision changes. He was extremely dizzy also when he walked to the stretcher to the EMS. When he presented to the Emergency Room he had a temperature of 100.8. He was tachycardic with a heart rate of 110. Initially his blood pressure was 146/70 but then his blood pressure dropped during the ER course. His blood pressure went down to 78 to 80 systolic. He got about 2 liters of normal saline bolus already but he was still hypotensive so hospitalist was asked to admit to the patient because of fever and hypotension. He denies any sick contacts. He denies taking any antibiotics at home. He denies any recent dental extraction or any intervention done. His initial work-up in the Emergency Room shows a slightly elevated white count of 10.9. His urinalysis is negative. Negative for influenza A and B. He got a dose of ceftriaxone IV and he got 500 mg of Levaquin p.o. He said his shortness of breath is better but he still feels sick so he is being admitted for fever of unknown origin along with hypotension.   REVIEW OF SYSTEMS: Positive for fever, chills, and weakness. No acute  change in vision. He is complaining of headache, some runny nose. He denies any sore throat now. No difficulty swallowing. No sinus pain. He denies any cough. He had some shortness of breath initially last night. No chest pain. No dyspnea on exertion. No palpitations. No nausea, vomiting, diarrhea, abdominal pain, gastrointestinal bleed. No dysuria. No frequency. No thyroid problems. No anemia. No rash. He is complaining of lower back pain. 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 No focal numbness or weakness. No anxiety, depression.    PAST MEDICAL HISTORY:  1. History of migraines. He used to take Topamax in the past for prophylaxis but he stopped that. 2. Benign prostatic hypertrophy.   PAST SURGICAL HISTORY:  1. Tonsillectomy. 2. Bunion surgery on the foot.    ALLERGIES: Penicillin.   HOME MEDICATIONS:  1. Flomax 0.8 mg a day. 2. Multivitamins with iron. 3. Omega-3 fatty acid 2 tablets once daily.   SOCIAL HISTORY: He works as an Production designer, theatre/television/film at [employer] in [place]. He occasionally smokes cigars. He drinks a glass of wine daily. Denies any drug use.   FAMILY HISTORY: Mother was diabetic in late 57s. Also history of stroke and heart disease in the family.   PHYSICAL EXAMINATION:  VITAL SIGNS: His vitals when he presented to the Emergency Room: Temperature 100.8, heart rate 110, respiratory rate 22, blood pressure 146/70, saturating 100% on room air. Currently, heart rate  84, blood pressure 97/61, saturating 100% on room air with respiratory rate 13.   GENERAL: This is a middle-aged [place] male, well built, obese, appears to be slightly toxic in appearance.   HEENT: Bilateral pupils are equal and reactive. Extraocular muscles are intact. No scleral icterus. No conjunctivitis. Oral mucosa is moist. No pallor. Throat no exudate.   NECK: No thyroid tenderness, enlargement or nodule. Neck is supple. No masses, nontender. No  adenopathy. No JVD. No carotid bruit. Neck is supple.   CHEST: He has bilaterally diminished breath sounds. He is diminished at the bases bilaterally. Normal respiratory effort. Not using accessory muscles of respiration.   HEART: Heart sounds are regular. No murmur. Good peripheral pulses. No lower extremity edema.   ABDOMEN: Soft, nontender. Normal bowel sounds. No hepatosplenomegaly. No bruits. No masses.   RECTAL: Deferred.   LYMPH: Could not appreciate any enlarged lymph nodes or tenderness in the groin area.   NEUROLOGIC: He is awake, alert, oriented to time, place, and person. Cranial nerves are intact. Moving all extremities against gravity.   EXTREMITIES: No cyanosis. No clubbing.   SKIN: No rash. No lesions.   LABORATORY, DIAGNOSTIC AND RADIOLOGICAL DATA: White count 10.9, hemoglobin 14.6, platelet count 178,000. He has 87.3% neutrophils. BMP: Sodium 138, potassium 3.9, BUN 15, creatinine 1.24, AST slightly elevated at 39. Urinalysis essentially negative, ketones negative, nitrite and leukocyte esterase negative. Influenza A and B negative. His EKG shows sinus tachycardia at 109. No acute ischemic changes.   IMPRESSION:  1. He presented with fever, tachycardia and hypotension,Systemic inflammatory response syndrome. It could be secondary to viral syndrome.  2. Hypotension.  3. Mild leukocytosis. 4. Benign prostatic hypertrophy.   PLAN: A 59 year old male who has history of benign prostatic hypertrophy, history of migraines. He presents with sudden onset of chills, fever, tachycardia and he is hypotensive. His urinalysis is negative. Influenza A and B is negative. He has slight leukocytosis with neutrophilic predominance. His blood cultures and urine cultures have been sent. His neck is supple. His chest x-rays may be having some opacities in the lower lung. He got a dose of ceftriaxone and Levaquin in the Emergency Room. Since he is allergic to penicillin I am going to continue  Levaquin on him empirically and wait for cultures to come back. Will also give him aggressive IV hydration. Considering his generalized body ache, fever, chills this could be viral syndrome also. Throat does not show any exudate. Beta strep culture has been sent already. Will admit him to observation.   TIME SPENT WITH ADMISSION AND COORDINATION: 50 minutes.    ____________________________ Fredia SorrowAbhinav Daylah Sayavong, MD ag:cms D: 08/17/2011 08:48:19 ET T: 08/17/2011 11:22:02 ET JOB#: [id number]  cc: Fredia SorrowAbhinav Chandelle Harkey, MD, <Dictator> Fredia SorrowABHINAV Jaymee Tilson MD ELECTRONICALLY SIGNED 09/10/2011 12:11

## 2014-09-12 NOTE — Discharge Summary (Signed)
PATIENT NAME:  Nicholas Flores, Nicholas Flores MR#:  [id number] DATE OF BIRTH:  [birth year]  DATE OF ADMISSION:  08/17/2011 DATE OF DISCHARGE:  08/19/2011  DISCHARGE DIAGNOSES:  1. Systemic inflammatory response syndrome, suspect secondary to streptococcus agalactiae bacteremia.  2. Leukocytosis, resolved. 3. Hypotension, resolved.  4. History of benign prostatic hypertrophy. 5. Migraine headache.   HOSPITAL COURSE: A 59 year old male with history of benign prostatic hypertrophy and migraine headaches. He presented with sudden onset of fever and chills. When he came to the Emergency Room his temperature was 100.8. He was tachycardic with a heart rate of 110. His initial blood pressure was okay and then his blood pressure dropped to 78 to 80 systolic in the Emergency Room. He also had elevated white count in the range of 10.9. He was admitted as systemic inflammatory response syndrome with no apparent source of infection. His chest x-ray when he came in was essentially negative. He had a urinalysis that was negative. He was negative for influenza A and B. His creatinine was normal at 1.25. AST slightly elevated at 39, essentially normal. LFTs were normal. He was started empirically on IV Levaquin. His blood cultures were sent. Anaerobic bottle grew Streptococcus agalactiae which was sensitive to levaquin, ampicillin. The other bottle was negative.. He had a throat beta strep sent that was negative. Initially he was started on vancomycin also because of gram-positive cocci but when we found out that it was streptococcus agalactiae sensitive to Levaquin, he was continued on Levaquin. He got three doses of Levaquin in the hospital. I am going to give him seven more days to complete 10 days of antibiotics. His repeat white count has normalized to 6.4. His creatinine is stable at 1.22. His AST has normalized to 18. He had no further fever. His blood pressure is stable. Because we could not find an apparent source of infection  echocardiogram was done which showed ejection fraction of 55%. No apparent source of vegetation. Mild to moderate TR. He also complained of some headache during the hospital stay, received some Fioricet but that has resolved right now. Advised to follow up with his primary care physician and then he needs follow up CBC and MET-B drawn. He complained of left leg pain last night but that has resolved right now. Will ambulate him before discharge. There does not appear to be any left lower extremity swelling.   DISCHARGE MEDICATIONS: His medications at discharge include his home medications which include:  1. Flomax 0.4 mg 2 capsules once daily.  2. Multivitamin with iron daily.  3. Omega-3 2 tablets daily.  4. New medication: Levaquin 500 mg p.o. once daily for seven days.   DIET: Regular diet.   CONDITION AT DISCHARGE: He is comfortable.   PHYSICAL EXAMINATION: VITAL SIGNS: T-max 98.6, heart rate 81, blood pressure 113/72 to 118/78, saturating 97% on room air. Well hydrated. Chest clear. Heart sounds are regular. Abdomen soft, nontender. No swelling in the left leg. Homans sign is negative. He is awake, alert, oriented x3.   FOLLOW UP: Patient should follow up with PMD in [place] in one week. Follow up CBC and BMP in one week at your PMDs office. If any worsening of leg pain or leg swelling then he should follow up with his primary care physician early. I have also provided him with a doctor's excuse for work.   TIME SPENT WITH DISCHARGE: 40 minutes.   ____________________________ Mena Pauls, MD ag:cms D: 08/19/2011 11:49:09 ET T: 08/20/2011 14:16:24 ET JOB#: [id number]  cc:  Mena Pauls, MD, <Dictator> Dr. Julieanne Cotton, Judith Part MD ELECTRONICALLY SIGNED 08/30/2011 17:21

## 2014-09-20 ENCOUNTER — Other Ambulatory Visit: Payer: Self-pay | Admitting: *Deleted

## 2014-09-20 MED ORDER — HYDROCODONE-ACETAMINOPHEN 5-325 MG PO TABS: 1.0000 | ORAL_TABLET | Freq: Four times a day (QID) | ORAL | Status: DC | PRN

## 2014-09-20 MED ORDER — HYDROCODONE-ACETAMINOPHEN 5-500 MG PO TABS: 1.0000 | ORAL_TABLET | Freq: Four times a day (QID) | ORAL | Status: DC | PRN

## 2014-09-20 NOTE — Telephone Encounter (Signed)
[facility] Lenord FellersBaxley

## 2014-10-17 ENCOUNTER — Emergency Department
Admission: EM | Admit: 2014-10-17 | Discharge: 2014-10-17 | Disposition: A | Discharge status: Home / Self Care | Payer: BC Managed Care – PPO | Attending: Student | Admitting: Student

## 2014-10-17 ENCOUNTER — Emergency Department: Payer: BC Managed Care – PPO

## 2014-10-17 ENCOUNTER — Encounter: Payer: Self-pay | Admitting: Emergency Medicine

## 2014-10-17 DIAGNOSIS — R600 Localized edema: Secondary | ICD-10-CM | POA: Diagnosis not present

## 2014-10-17 DIAGNOSIS — M79671 Pain in right foot: Secondary | ICD-10-CM

## 2014-10-17 DIAGNOSIS — Z7982 Long term (current) use of aspirin: Secondary | ICD-10-CM | POA: Diagnosis not present

## 2014-10-17 DIAGNOSIS — Z880 Allergy status to penicillin: Secondary | ICD-10-CM | POA: Insufficient documentation

## 2014-10-17 DIAGNOSIS — R2241 Localized swelling, mass and lump, right lower limb: Secondary | ICD-10-CM | POA: Diagnosis present

## 2014-10-17 DIAGNOSIS — Z87891 Personal history of nicotine dependence: Secondary | ICD-10-CM | POA: Diagnosis not present

## 2014-10-17 DIAGNOSIS — M25571 Pain in right ankle and joints of right foot: Secondary | ICD-10-CM | POA: Diagnosis not present

## 2014-10-17 DIAGNOSIS — Z79899 Other long term (current) drug therapy: Secondary | ICD-10-CM | POA: Insufficient documentation

## 2014-10-17 HISTORY — DX: Benign prostatic hyperplasia without lower urinary tract symptoms: N40.0

## 2014-10-17 MED ORDER — HYDROCODONE-ACETAMINOPHEN 5-325 MG PO TABS: 1.0000 | ORAL_TABLET | ORAL | Status: DC | PRN

## 2014-10-17 NOTE — Discharge Instructions (Signed)

## 2014-10-17 NOTE — ED Notes (Addendum)
Pt reports for past week increased swelling in right ankle and pain to right foot and ankle. Pt states that in Feb. He had surgery to left leg states that he is using his right leg more. Denies calf pain or SOB. Denies new injury

## 2014-10-17 NOTE — ED Provider Notes (Signed)
[facility] Emergency Department Provider Note  ____________________________________________  Time seen: Approximately 1:15 PM  I have reviewed the triage vital signs and the nursing notes.   HISTORY  Chief Complaint Joint Swelling    HPI Nicholas Flores is a 59 y.o. male who presents to the emergency department for right ankle and foot pain and swelling. He had a left knee surgery in February and had been using a cane until about 2 weeks ago. He states he's been putting a lot of extra weight on that right foot. He denies injury. He's been taking Aleve for pain. He states that it is typical for his lower extremities to be swollen at the end of the day, however it's not usual for his ankle to be swollen in the morning. He states that this morning he was unable to put any weight on his right foot and ankle.   Past Medical History  Diagnosis Date  . BPH (benign prostatic hyperplasia)     Patient Active Problem List   Diagnosis Date Noted  . History of migraine headaches 04/18/2012  . BPH (benign prostatic hyperplasia) 04/18/2012  . Hyperlipidemia 04/18/2012    Past Surgical History  Procedure Laterality Date  . Left knee surgery    . Left quadricep surgery      Current Outpatient Rx  Name  Route  Sig  Dispense  Refill  . aspirin-acetaminophen-caffeine (EXCEDRIN MIGRAINE) 250-250-65 MG per tablet   Oral   Take 1 tablet by mouth every 6 (six) hours as needed.         Marland Kitchen. HYDROcodone-acetaminophen (NORCO/VICODIN) 5-325 MG per tablet   Oral   Take 1 tablet by mouth every 4 (four) hours as needed for moderate pain.   12 tablet   0   . mometasone (NASONEX) 50 MCG/ACT nasal spray   Nasal   Place 2 sprays into the nose daily as needed.   17 g   11   . Multiple Vitamin (MULTIVITAMIN) tablet   Oral   Take 1 tablet by mouth daily.         . Tamsulosin HCl (FLOMAX) 0.4 MG CAPS   Oral   Take 0.4 mg by mouth 2 (two) times daily.         Marland Kitchen.  terbinafine (LAMISIL) 250 MG tablet   Oral   Take 250 mg by mouth.           Allergies Penicillins  Family History  Problem Relation Age of Onset  . Stroke Mother     Social History History  Substance Use Topics  . Smoking status: Former Smoker    Types: Cigars  . Smokeless tobacco: Never Used  . Alcohol Use: 7.8 oz/week    12 Standard drinks or equivalent, 1 Glasses of wine per week    Review of Systems Constitutional: No fever/chills Eyes: No visual changes. ENT: No sore throat. Cardiovascular: Denies chest pain. Respiratory: Denies shortness of breath. Musculoskeletal: Right ankle and foot pain Skin: Negative for injury. Neurological: Negative for headaches, focal weakness or numbness.  10-point ROS otherwise negative.  ____________________________________________   PHYSICAL EXAM:  VITAL SIGNS: ED Triage Vitals  Enc Vitals Group     BP 10/17/14 1203 117/82 mmHg     Pulse Rate 10/17/14 1203 77     Resp 10/17/14 1203 18     Temp 10/17/14 1203 98.1 F (36.7 C)     Temp Source 10/17/14 1203 Oral     SpO2 10/17/14 1203 98 %  Weight 10/17/14 1203 240 lb (108.863 kg)     Height 10/17/14 1203  (1.753 m)     Head Cir --      Peak Flow --      Pain Score 10/17/14 1212 8     Pain Loc --      Pain Edu? --      Excl. in GC? --     Constitutional: Alert and oriented. Well appearing and in no acute distress. Eyes: Conjunctivae are normal. PERRL. EOMI. Head: Atraumatic. Nose: No congestion/rhinnorhea. Mouth/Throat: Mucous membranes are moist.  Oropharynx non-erythematous. Neck: No stridor.   Cardiovascular:  Good peripheral circulation. Respiratory: Normal respiratory effort.    Musculoskeletal: Bilateral lower extremity edema. Tenderness in the malleolar zone and right lateral foot. Neurologic:  Normal speech and language. No gross focal neurologic deficits are appreciated. Speech is normal.  Gait not tested due to pain. Skin:  Skin is warm, dry  and intact. No rash noted. Psychiatric: Mood and affect are normal. Speech and behavior are normal.  ____________________________________________   LABS (all labs ordered are listed, but only abnormal results are displayed)  Labs Reviewed - No data to display ____________________________________________  EKG   ____________________________________________  RADIOLOGY  Negative for acute injury. ____________________________________________   PROCEDURES  Procedure(s) performed: None  Critical Care performed: No  ____________________________________________   INITIAL IMPRESSION / ASSESSMENT AND PLAN / ED COURSE  Pertinent labs & imaging results that were available during my care of the patient were reviewed by me and considered in my medical decision making (see chart for details)  Initial impression: Tendinitis. We'll order x-ray to ensure he does not have evidence of a stress fracture  ----------------------------------------- 2:51 PM on 10/17/2014 -----------------------------------------  Ace wrap applied. Patient was advised to follow-up with Dr. Katrinka Blazing. He was also advised to start using his cane again until pain free or until Dr. Katrinka Blazing advises him otherwise. He will receive a prescription for Norco. He was also advised that he can continue taking the Aleve. ____________________________________________   FINAL CLINICAL IMPRESSION(S) / ED DIAGNOSES  Final diagnoses:  Ankle pain, right  Foot pain, right      Chinita Pester, FNP 10/17/14 1452  Gayla Doss, MD 10/18/14 [phone]

## 2014-10-28 ENCOUNTER — Other Ambulatory Visit: Payer: BC Managed Care – PPO | Admitting: Internal Medicine

## 2014-10-28 ENCOUNTER — Other Ambulatory Visit: Payer: Self-pay | Admitting: Internal Medicine

## 2014-10-28 DIAGNOSIS — Z113 Encounter for screening for infections with a predominantly sexual mode of transmission: Secondary | ICD-10-CM

## 2014-10-28 DIAGNOSIS — Z79899 Other long term (current) drug therapy: Secondary | ICD-10-CM

## 2014-10-28 DIAGNOSIS — E785 Hyperlipidemia, unspecified: Secondary | ICD-10-CM

## 2014-10-28 LAB — HEPATIC FUNCTION PANEL
ALBUMIN: 3.8 g/dL (ref 3.5–5.2)
ALT: 22 U/L (ref 0–53)
AST: 19 U/L (ref 0–37)
Alkaline Phosphatase: 65 U/L (ref 39–117)
BILIRUBIN DIRECT: 0.1 mg/dL (ref 0.0–0.3)
Indirect Bilirubin: 0.4 mg/dL (ref 0.2–1.2)
Total Bilirubin: 0.5 mg/dL (ref 0.2–1.2)
Total Protein: 7.1 g/dL (ref 6.0–8.3)

## 2014-10-28 LAB — LIPID PANEL
CHOLESTEROL: 206 mg/dL — AB (ref 0–200)
HDL: 43 mg/dL (ref >=40)
LDL Cholesterol: 149 mg/dL — ABNORMAL HIGH (ref 0–99)
TRIGLYCERIDES: 68 mg/dL (ref <150)
Total CHOL/HDL Ratio: 4.8 Ratio
VLDL: 14 mg/dL (ref 0–40)

## 2014-10-28 NOTE — Addendum Note (Signed)
Addended by: Thomasena Edis on: 10/28/2014 10:36 AM   Modules accepted: Orders

## 2014-10-29 ENCOUNTER — Telehealth: Payer: Self-pay | Admitting: *Deleted

## 2014-10-29 ENCOUNTER — Encounter: Payer: Self-pay | Admitting: Internal Medicine

## 2014-10-29 ENCOUNTER — Ambulatory Visit (INDEPENDENT_AMBULATORY_CARE_PROVIDER_SITE_OTHER): Payer: BC Managed Care – PPO | Admitting: Internal Medicine

## 2014-10-29 VITALS — BP 112/72 | HR 93 | Temp 97.8°F | Wt 254.0 lb

## 2014-10-29 DIAGNOSIS — S76112S Strain of left quadriceps muscle, fascia and tendon, sequela: Secondary | ICD-10-CM

## 2014-10-29 DIAGNOSIS — Z9889 Other specified postprocedural states: Secondary | ICD-10-CM

## 2014-10-29 DIAGNOSIS — E785 Hyperlipidemia, unspecified: Secondary | ICD-10-CM | POA: Diagnosis not present

## 2014-10-29 DIAGNOSIS — M25571 Pain in right ankle and joints of right foot: Secondary | ICD-10-CM | POA: Diagnosis not present

## 2014-10-29 DIAGNOSIS — Z8669 Personal history of other diseases of the nervous system and sense organs: Secondary | ICD-10-CM | POA: Diagnosis not present

## 2014-10-29 LAB — HIV ANTIBODY (ROUTINE TESTING W REFLEX): HIV: NONREACTIVE

## 2014-10-29 LAB — URIC ACID: URIC ACID, SERUM: 7.5 mg/dL (ref 4.0–7.8)

## 2014-10-29 MED ORDER — MOMETASONE FUROATE 50 MCG/ACT NA SUSP: 2.0000 | Freq: Every day | NASAL | Status: DC | PRN

## 2014-10-29 NOTE — Progress Notes (Signed)
   Subjective:    Patient ID: Nicholas Flores, male    DOB: September 14, 1955, 59 y.o.   MRN: [id number]  HPI  Nicholas Flores had a very severe fall in [place] while attending a hair show October 2015. He injured his left knee in this fall and subsequently required left knee surgery and quadriceps repair by orthopedist at [facility]. He's been getting around fairly well but has developed some right ankle pain and swelling. He had to go back to work after his knee surgery and be on his feet quite a bit. 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 He was seen at [facility] in May for right ankle pain. X-ray was negative. He was having some issues with dependent edema. This has improved. He feels he was over compensating with right lower extremity for sore left lower extremity.  He is here to follow-up on hyperlipidemia. Total cholesterol is 206 with an LDL cholesterol of 149. Has not been able to exercise as much as he would like. Just beginning to ambulate well.    Review of Systems     Objective:   Physical Exam  Skin warm and dry. Nodes none. Chest clear. Cardiac exam regular rate and rhythm. Extremities without pitting edema      Assessment & Plan:  Status post left knee surgery and quadriceps  repair  Hyperlipidemia-no significant change in LDL cholesterol. Continue diet and exercise efforts and return in 6 months  Right ankle pain-likely compensatory from having to use right lower extremity to bear weight while left lower extremity was feeling  History of migraine headaches  Plan: Physical exam due in 6 months

## 2014-10-29 NOTE — Telephone Encounter (Signed)
Patient voicemail full 

## 2014-11-01 NOTE — Telephone Encounter (Signed)
Reviewed labs with patient. °

## 2014-11-15 ENCOUNTER — Encounter: Payer: Self-pay | Admitting: Internal Medicine

## 2014-11-15 NOTE — Patient Instructions (Signed)
Continue diet and exercise efforts and return in 6 months for physical examination

## 2015-02-07 ENCOUNTER — Telehealth: Payer: Self-pay | Admitting: Internal Medicine

## 2015-02-07 NOTE — Telephone Encounter (Signed)
Wants to know what your opinion is of the [facility] (that has been on TV advertising).  He is interested in doing it but doesn't want to proceed until after getting your input.

## 2015-02-09 NOTE — Telephone Encounter (Signed)
I do not know what the program entails.  He should inquire what treatment plan is. Could it be drugs, exercise, required lab work, costs, physician visits they arrange, vitamins? I just do not know.

## 2015-04-28 ENCOUNTER — Other Ambulatory Visit: Payer: BC Managed Care – PPO | Admitting: Internal Medicine

## 2015-04-28 DIAGNOSIS — Z79899 Other long term (current) drug therapy: Secondary | ICD-10-CM

## 2015-04-28 DIAGNOSIS — N400 Enlarged prostate without lower urinary tract symptoms: Secondary | ICD-10-CM

## 2015-04-28 DIAGNOSIS — E785 Hyperlipidemia, unspecified: Secondary | ICD-10-CM

## 2015-04-28 DIAGNOSIS — Z0000 Encounter for general adult medical examination without abnormal findings: Secondary | ICD-10-CM

## 2015-04-28 LAB — COMPLETE METABOLIC PANEL WITH GFR
ALBUMIN: 4 g/dL (ref 3.6–5.1)
ALK PHOS: 63 U/L (ref 40–115)
ALT: 20 U/L (ref 9–46)
AST: 22 U/L (ref 10–35)
BUN: 20 mg/dL (ref 7–25)
CALCIUM: 9.5 mg/dL (ref 8.6–10.3)
CO2: 30 mmol/L (ref 20–31)
Chloride: 103 mmol/L (ref 98–110)
Creat: 1.02 mg/dL (ref 0.70–1.33)
GFR, EST NON AFRICAN AMERICAN: 80 mL/min (ref >=60)
GFR, Est African American: >89 mL/min (ref >=60)
Glucose, Bld: 89 mg/dL (ref 65–99)
POTASSIUM: 4.5 mmol/L (ref 3.5–5.3)
Sodium: 138 mmol/L (ref 135–146)
Total Bilirubin: 0.4 mg/dL (ref 0.2–1.2)
Total Protein: 7.2 g/dL (ref 6.1–8.1)

## 2015-04-28 LAB — CBC WITH DIFFERENTIAL/PLATELET
Basophils Absolute: 0 10*3/uL (ref 0.0–0.1)
Basophils Relative: 1 % (ref 0–1)
Eosinophils Absolute: 0.1 10*3/uL (ref 0.0–0.7)
Eosinophils Relative: 3 % (ref 0–5)
HCT: 41.3 % (ref 39.0–52.0)
Hemoglobin: 13.9 g/dL (ref 13.0–17.0)
LYMPHS ABS: 1.2 10*3/uL (ref 0.7–4.0)
Lymphocytes Relative: 34 % (ref 12–46)
MCH: 29.7 pg (ref 26.0–34.0)
MCHC: 33.7 g/dL (ref 30.0–36.0)
MCV: 88.2 fL (ref 78.0–100.0)
MONO ABS: 0.5 10*3/uL (ref 0.1–1.0)
MONOS PCT: 13 % — AB (ref 3–12)
MPV: 9.1 fL (ref 8.6–12.4)
NEUTROS ABS: 1.8 10*3/uL (ref 1.7–7.7)
Neutrophils Relative %: 49 % (ref 43–77)
Platelets: 188 10*3/uL (ref 150–400)
RBC: 4.68 MIL/uL (ref 4.22–5.81)
RDW: 14.3 % (ref 11.5–15.5)
WBC: 3.6 10*3/uL — ABNORMAL LOW (ref 4.0–10.5)

## 2015-04-28 LAB — LIPID PANEL
CHOL/HDL RATIO: 3.9 ratio (ref <=5.0)
Cholesterol: 207 mg/dL — ABNORMAL HIGH (ref 125–200)
HDL: 53 mg/dL (ref >=40)
LDL Cholesterol: 141 mg/dL — ABNORMAL HIGH (ref <130)
TRIGLYCERIDES: 64 mg/dL (ref <150)
VLDL: 13 mg/dL (ref <30)

## 2015-04-29 ENCOUNTER — Encounter: Payer: Self-pay | Admitting: Internal Medicine

## 2015-04-29 ENCOUNTER — Ambulatory Visit (INDEPENDENT_AMBULATORY_CARE_PROVIDER_SITE_OTHER): Payer: BC Managed Care – PPO | Admitting: Internal Medicine

## 2015-04-29 VITALS — BP 120/80 | HR 86 | Temp 97.7°F | Ht 67.5 in | Wt 259.0 lb

## 2015-04-29 DIAGNOSIS — Z8669 Personal history of other diseases of the nervous system and sense organs: Secondary | ICD-10-CM | POA: Diagnosis not present

## 2015-04-29 DIAGNOSIS — Z0000 Encounter for general adult medical examination without abnormal findings: Secondary | ICD-10-CM | POA: Diagnosis not present

## 2015-04-29 DIAGNOSIS — Z9889 Other specified postprocedural states: Secondary | ICD-10-CM | POA: Diagnosis not present

## 2015-04-29 DIAGNOSIS — E785 Hyperlipidemia, unspecified: Secondary | ICD-10-CM

## 2015-04-29 DIAGNOSIS — N400 Enlarged prostate without lower urinary tract symptoms: Secondary | ICD-10-CM | POA: Diagnosis not present

## 2015-04-29 LAB — POCT URINALYSIS DIPSTICK
Bilirubin, UA: NEGATIVE
Glucose, UA: NEGATIVE
KETONES UA: NEGATIVE
Leukocytes, UA: NEGATIVE
Nitrite, UA: NEGATIVE
PH UA: 6
Protein, UA: NEGATIVE
Spec Grav, UA: 1.02
UROBILINOGEN UA: NEGATIVE

## 2015-04-29 LAB — PSA: PSA: 1.36 ng/mL (ref <=4.00)

## 2015-04-29 MED ORDER — HYDROCODONE-ACETAMINOPHEN 5-325 MG PO TABS: 1.0000 | ORAL_TABLET | ORAL | Status: DC | PRN

## 2015-04-29 MED ORDER — SIMVASTATIN 10 MG PO TABS: 10.0000 mg | ORAL_TABLET | Freq: Every day | ORAL | Status: DC

## 2015-04-29 NOTE — Patient Instructions (Addendum)
Hydrocodone refilled for headaches. Start Zocor 10 mg daily and RTC 4 months

## 2015-07-17 NOTE — Progress Notes (Signed)
Subjective:    Patient ID: Nicholas Flores, male    DOB: August 19, 1955, 60 y.o.   MRN: [id number]  HPI  Pleasant 60 year old Black Male in today for health maintenance exam and evaluation of hyperlipidemia. While in a hair show in [place] in October 2015, he had a terrible fall in a parking lot it injured his knee. Subsequently had to have knee surgery in [place]. He underwent left quadriceps tendon repair, partial lateral meniscectomy and chondroplasty 07/01/2014 by Dr. Garvin Fila at [facility].  Also developed some right ankle pain which was seen and treated at [facility] May 2016. Patient thought this was due to bearing more weight on  right lower extremity after left knee surgery.  History of migraine headaches treated with Excedrin migraine. Received flu vaccine at work.   History of elevated LDL cholesterol. History of BPH treated with Flomax per Dr. Brunilda Payor.   He is allergic to Penicillin.  Past medical history: History of lactose intolerance and irritable bowel syndrome. Right bunionectomy in the mid 1990s by Dr. Dellia Nims. History of toenail fungus treated with Dr. Dellia Nims.  Records from [facility] indicate patient is status post tonsillectomy in the remote past and has prior history of gout  Social history: He completed 4 years of college. He is single. He is a Astronomer at [employer]. Smokes an occasional cigar. He used to smoke a half pack cigarettes daily for some 12 years but quit some 16 years ago. Drinks red wine daily. Generally has about 80 ounces a day and is done this for about 20 years. Has tried working out in the past but with recent knee issues has not been able to work out.  Family history: Twin brother in good health. Mother living in her 79s with history of stroke.   Evaluated for chest pain in May 2015 at [facility] in [place]. See care everywhere.    Review of Systems  Respiratory: Negative.   Cardiovascular: Negative.   Musculoskeletal:         Knee and ankle pain  Neurological: Negative.   Hematological: Negative.   Psychiatric/Behavioral: Negative.        Objective:   Physical Exam  Constitutional: He is oriented to person, place, and time. He appears well-developed and well-nourished. No distress.  HENT:  Head: Normocephalic.  Right Ear: External ear normal.  Left Ear: External ear normal.  Mouth/Throat: Oropharynx is clear and moist. No oropharyngeal exudate.  Eyes: Conjunctivae and EOM are normal. Pupils are equal, round, and reactive to light. Right eye exhibits no discharge. Left eye exhibits no discharge. No scleral icterus.  Neck: Neck supple. No JVD present. No thyromegaly present.  Cardiovascular: Normal rate, regular rhythm and normal heart sounds.   No murmur heard. Pulmonary/Chest: Effort normal and breath sounds normal. He has no wheezes. He has no rales.  Abdominal: Soft. Bowel sounds are normal. He exhibits no distension and no mass. There is no rebound.  Genitourinary: Prostate normal.  Musculoskeletal: He exhibits no edema.  Lymphadenopathy:    He has no cervical adenopathy.  Neurological: He is alert and oriented to person, place, and time. He has normal reflexes. No cranial nerve deficit. Coordination normal.  Skin: Skin is warm and dry. No rash noted. He is not diaphoretic.  Psychiatric: He has a normal mood and affect. His behavior is normal. Judgment and thought content normal.  Vitals reviewed.         Assessment & Plan:   Hyperlipidemia-I persuaded  patient to start statin therapy and return in 4 months for fasting lipid panel liver functions and office visit. LDL remains in the 140 range.  Status post left knee surgery with quadriceps tendon repair, partial left lateral meniscectomy and chondroplasty after a fall  History of migraine headaches  BPH-treated with Flomax  Plan: Trial of   lipid-lowering medication and return in 4 months for fasting lab work and office visit.

## 2015-08-01 ENCOUNTER — Telehealth: Payer: Self-pay | Admitting: Internal Medicine

## 2015-08-01 NOTE — Telephone Encounter (Signed)
States that when he went to the Urologist (Nicholas Flores), he actually saw the NP.  Nicholas Flores said that since you are actually drawing his PSA and doing his prostate exam, Nicholas Flores would actually be ok with you prescribing his Flomax as well.  He can come to see them on a prn basis.  He's paying a co-pay for a refill on Flomax (that's what the NP told him.  Nicholas Flores referred Nicholas Flores to Nicholas Flores years ago, but you have been doing everything that Nicholas Flores would have been doing all these years.   Will you refill his Flomax?  Please advise.    [facility]

## 2015-08-01 NOTE — Telephone Encounter (Signed)
Refill Flomax for one year and let pt. Know please.

## 2015-08-03 MED ORDER — TAMSULOSIN HCL 0.4 MG PO CAPS: 0.4000 mg | ORAL_CAPSULE | Freq: Two times a day (BID) | ORAL | Status: DC

## 2015-08-03 NOTE — Telephone Encounter (Signed)
Medication refill sent to pharmacy  

## 2015-08-26 ENCOUNTER — Ambulatory Visit: Payer: BC Managed Care – PPO | Admitting: Internal Medicine

## 2015-08-29 ENCOUNTER — Ambulatory Visit: Payer: BC Managed Care – PPO | Admitting: Internal Medicine

## 2015-08-29 ENCOUNTER — Other Ambulatory Visit: Payer: BC Managed Care – PPO | Admitting: Internal Medicine

## 2015-08-29 DIAGNOSIS — Z79899 Other long term (current) drug therapy: Secondary | ICD-10-CM

## 2015-08-29 DIAGNOSIS — E785 Hyperlipidemia, unspecified: Secondary | ICD-10-CM

## 2015-08-29 LAB — LIPID PANEL
CHOL/HDL RATIO: 3.1 ratio (ref <=5.0)
Cholesterol: 168 mg/dL (ref 125–200)
HDL: 55 mg/dL (ref >=40)
LDL Cholesterol: 103 mg/dL (ref <130)
Triglycerides: 50 mg/dL (ref <150)
VLDL: 10 mg/dL (ref <30)

## 2015-08-29 LAB — HEPATIC FUNCTION PANEL
ALT: 26 U/L (ref 9–46)
AST: 22 U/L (ref 10–35)
Albumin: 4.3 g/dL (ref 3.6–5.1)
Alkaline Phosphatase: 53 U/L (ref 40–115)
BILIRUBIN TOTAL: 0.5 mg/dL (ref 0.2–1.2)
Bilirubin, Direct: 0.1 mg/dL (ref <=0.2)
Indirect Bilirubin: 0.4 mg/dL (ref 0.2–1.2)
Total Protein: 7.1 g/dL (ref 6.1–8.1)

## 2015-08-30 ENCOUNTER — Encounter: Payer: Self-pay | Admitting: Internal Medicine

## 2015-08-30 ENCOUNTER — Ambulatory Visit (INDEPENDENT_AMBULATORY_CARE_PROVIDER_SITE_OTHER): Payer: BC Managed Care – PPO | Admitting: Internal Medicine

## 2015-08-30 VITALS — BP 112/76 | HR 81 | Temp 97.1°F | Resp 20 | Ht 68.0 in | Wt 256.0 lb

## 2015-08-30 DIAGNOSIS — E785 Hyperlipidemia, unspecified: Secondary | ICD-10-CM

## 2015-08-31 ENCOUNTER — Other Ambulatory Visit: Payer: Self-pay | Admitting: Internal Medicine

## 2015-08-31 ENCOUNTER — Telehealth: Payer: Self-pay | Admitting: Internal Medicine

## 2015-08-31 NOTE — Telephone Encounter (Signed)
Spoke with [facility]; patient had colonoscopy by Dr. Matthias HughsBuccini and is due to have colonoscopy in July of this year.  They will send a letter to patient indicating that he is due for procedure and he will be instructed to call their office and set-up his appointment.    Called patient and notified him of this information.

## 2015-09-14 NOTE — Progress Notes (Signed)
   Subjective:    Patient ID: Nicholas Flores, male    DOB: 02-15-1956, 60 y.o.   MRN: [id number]  HPI 60 year old Black male recently started on Zocor 10 mg daily for hyperlipidemia. In today for follow-up. Tolerating medication well without side effects.    Review of Systems     Objective:   Physical Exam  Not examined. Reviewed lipid panel and liver functions with him. Liver panel is within normal limits. Total cholesterol has improved from 207-168 over 4 months. Triglycerides have improved from 64-50. LDL cholesterol has improved from 141-103.      Assessment & Plan:  Hyperlipidemia-improved with low-dose statin therapy  Plan: Continue Zocor 10 mg daily and follow-up at time of physical examination December 2017.

## 2015-09-15 NOTE — Patient Instructions (Signed)
It was a pleasure to see you today. Continue Zocor 10 mg daily and follow-up at time of physical exam December 2017.

## 2015-09-26 ENCOUNTER — Ambulatory Visit (INDEPENDENT_AMBULATORY_CARE_PROVIDER_SITE_OTHER): Payer: BC Managed Care – PPO | Admitting: Podiatry

## 2015-09-26 ENCOUNTER — Encounter: Payer: Self-pay | Admitting: Podiatry

## 2015-09-26 VITALS — BP 128/81 | HR 91 | Resp 16

## 2015-09-26 DIAGNOSIS — B351 Tinea unguium: Secondary | ICD-10-CM | POA: Diagnosis not present

## 2015-09-26 MED ORDER — TERBINAFINE HCL 250 MG PO TABS: 250.0000 mg | ORAL_TABLET | Freq: Every day | ORAL | Status: DC

## 2015-09-26 NOTE — Progress Notes (Signed)
Subjective:     Patient ID: Nicholas Flores, male   DOB: Jun 17, 1955, 60 y.o.   MRN: [id number]  HPI patient presents with some crusted tissue on the dorsal aspect of the right foot lateral side that's localized in nature and he states occurred after a pedicure   Review of Systems     Objective:   Physical Exam Neurovascular status intact muscle strength adequate with changes of the dorsal lateral side of the right foot with inflammatory tissue formation but no current blistering or drainage    Assessment:     Probable contact dermatitis or possible fungal type infection    Plan:     Instructed on cortisone cream and oral Lamisil for 30 days. Patient will be seen back for us to recheck again if symptoms persist but this should alleviate the symptoms the patient is experiencing

## 2015-10-07 ENCOUNTER — Encounter: Payer: Self-pay | Admitting: Internal Medicine

## 2015-10-07 ENCOUNTER — Ambulatory Visit (INDEPENDENT_AMBULATORY_CARE_PROVIDER_SITE_OTHER): Payer: BC Managed Care – PPO | Admitting: Internal Medicine

## 2015-10-07 VITALS — BP 130/78 | HR 85 | Temp 98.7°F | Resp 18 | Wt 250.0 lb

## 2015-10-07 DIAGNOSIS — R030 Elevated blood-pressure reading, without diagnosis of hypertension: Secondary | ICD-10-CM

## 2015-10-07 DIAGNOSIS — R079 Chest pain, unspecified: Secondary | ICD-10-CM | POA: Diagnosis not present

## 2015-10-07 DIAGNOSIS — IMO0001 Reserved for inherently not codable concepts without codable children: Secondary | ICD-10-CM

## 2015-10-07 NOTE — Progress Notes (Signed)
   Subjective:    Patient ID: Nicholas Flores, male    DOB: 1955/06/14, 60 y.o.   MRN: [id number]  HPI 60 year old  Male went to dentist recently to have crown procedure and then have temporary removed and permanent placed. Each time blood pressure was elevated. Has had some chest pressure recently. No radiation to neck or down left arm. No diaphoresis nausea or vomiting. Doesn't feel that it's reflux symptoms. Dentist office referred him today for evaluation of blood pressure. He has no prior history of hypertension. He has hyperlipidemia which is mild. He is slightly obese. Chest discomfort is at rest and without exertion.  His dentist is Dr. Satira Sarkonya Redd. We called office and got several different blood pressure readings. Apparently  a wrist cuff was used on April 10 and blood pressure was 117/75 with pulse of 75. On April 17 blood pressure was 162/99 pulse 81 with a wrist cuff. Blood pressure was then rechecked on the same day and was 136/97 with an arm cuff. Was checked again and was 127/87 with pulse of 69 with the arm cuff.  On May 19 blood pressure was 134/90 and pulse was 96 with a wrist cuff. Blood pressure was 132/98; pulse 78 with a wrist cuff.   Review of Systems as above     Objective:   Physical Exam Skin warm and dry. Nodes none. Neck is supple without JVD thyromegaly or carotid bruits. Chest clear to auscultation. Cardiac exam regular rate and rhythm normal S1 and S2. Extremities without edema. EKG today shows normal sinus rhythm with no acute changes       Assessment & Plan:  Elevated blood pressure in stressful situation  Obesity  Chest pain/pressure-EKG is within normal limits and chest discomfort is at rest-suspect noncardiac chest pain  Plan: Patient is to purchase a home blood pressure monitor and we will reevaluate his blood pressure next week.

## 2015-10-07 NOTE — Patient Instructions (Signed)
EKG is within normal limits. Blood pressure normal here today. Obtain home blood pressure monitor and return next week for follow-up.

## 2015-10-11 ENCOUNTER — Telehealth: Payer: Self-pay | Admitting: Internal Medicine

## 2015-10-11 NOTE — Telephone Encounter (Signed)
Left message for patient to contact the office next week for a return to office BP check.

## 2015-10-11 NOTE — Telephone Encounter (Signed)
Patient returned call to make an appointment.  Appointment provided for Friday, 10/21/15 @ 4pm.  Patient confirmed.

## 2015-10-21 ENCOUNTER — Ambulatory Visit (INDEPENDENT_AMBULATORY_CARE_PROVIDER_SITE_OTHER): Payer: BC Managed Care – PPO | Admitting: Internal Medicine

## 2015-10-21 ENCOUNTER — Encounter: Payer: Self-pay | Admitting: Internal Medicine

## 2015-10-21 VITALS — BP 126/80 | HR 101 | Temp 98.4°F | Resp 20 | Wt 255.0 lb

## 2015-10-21 DIAGNOSIS — IMO0001 Reserved for inherently not codable concepts without codable children: Secondary | ICD-10-CM

## 2015-10-21 DIAGNOSIS — R030 Elevated blood-pressure reading, without diagnosis of hypertension: Secondary | ICD-10-CM

## 2015-10-21 DIAGNOSIS — F411 Generalized anxiety disorder: Secondary | ICD-10-CM

## 2015-10-21 MED ORDER — ALPRAZOLAM 0.25 MG PO TABS: 0.2500 mg | ORAL_TABLET | Freq: Two times a day (BID) | ORAL | Status: DC | PRN

## 2015-11-01 IMAGING — CR DG FOOT COMPLETE 3+V*R*
1 series · 3 of 3 positions shown · non-contrast
Comparison: None.

CLINICAL DATA: Right foot and ankle swelling

EXAM:
RIGHT FOOT COMPLETE - 3+ VIEW

[Series 1: x foot ap right · 0.14mm/px · 3 of 3 slices shown]
[im 1/3]
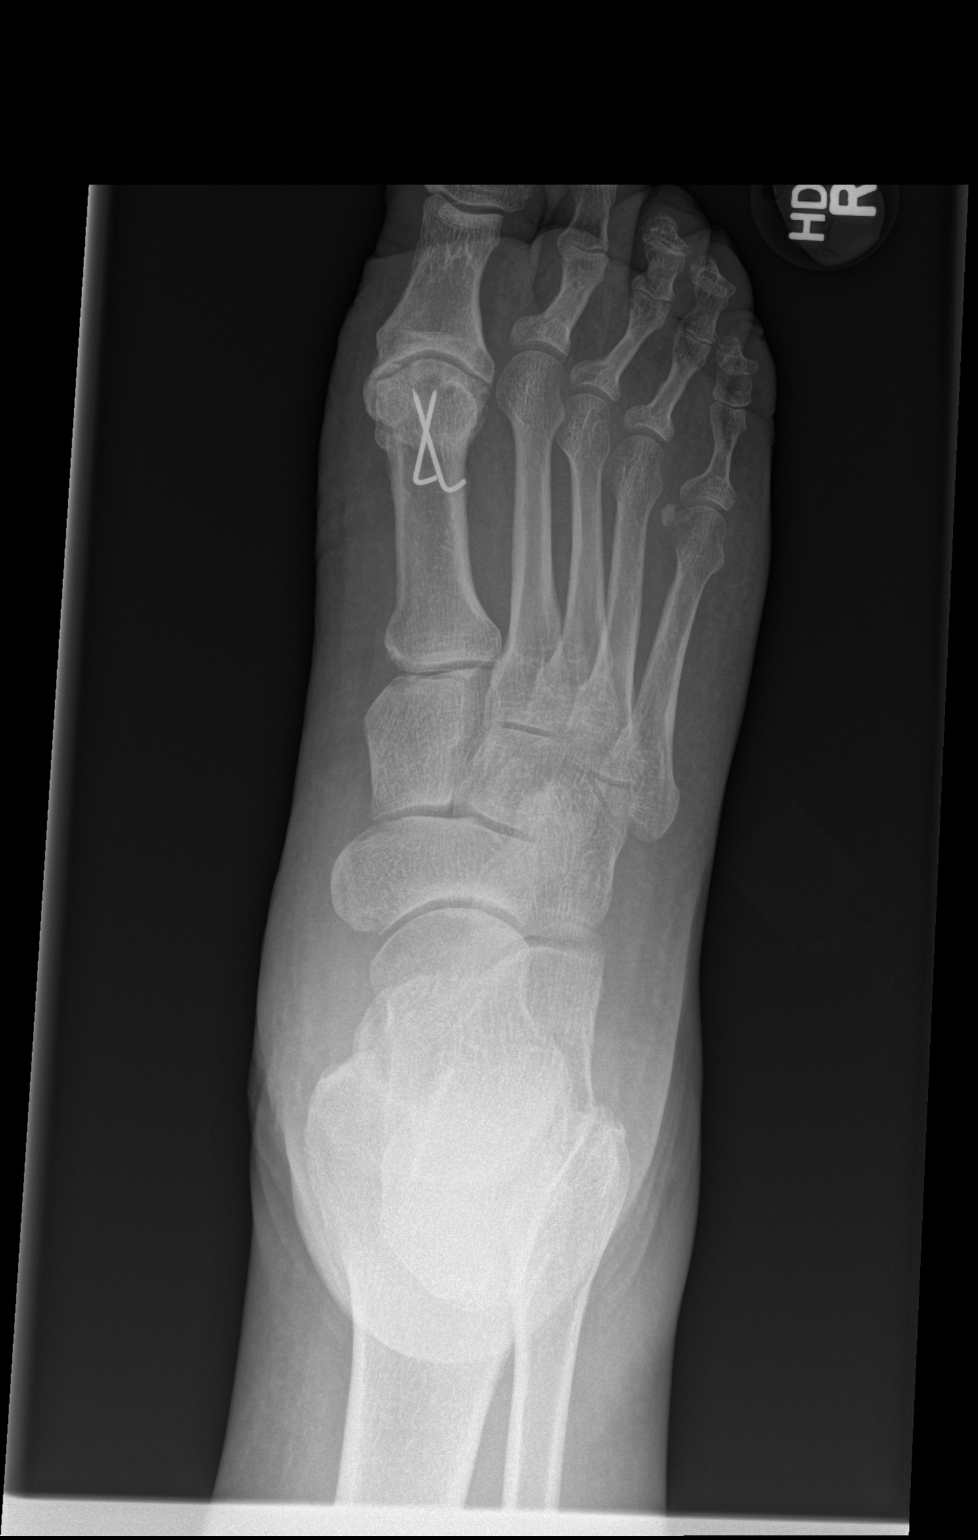
[im 2/3]
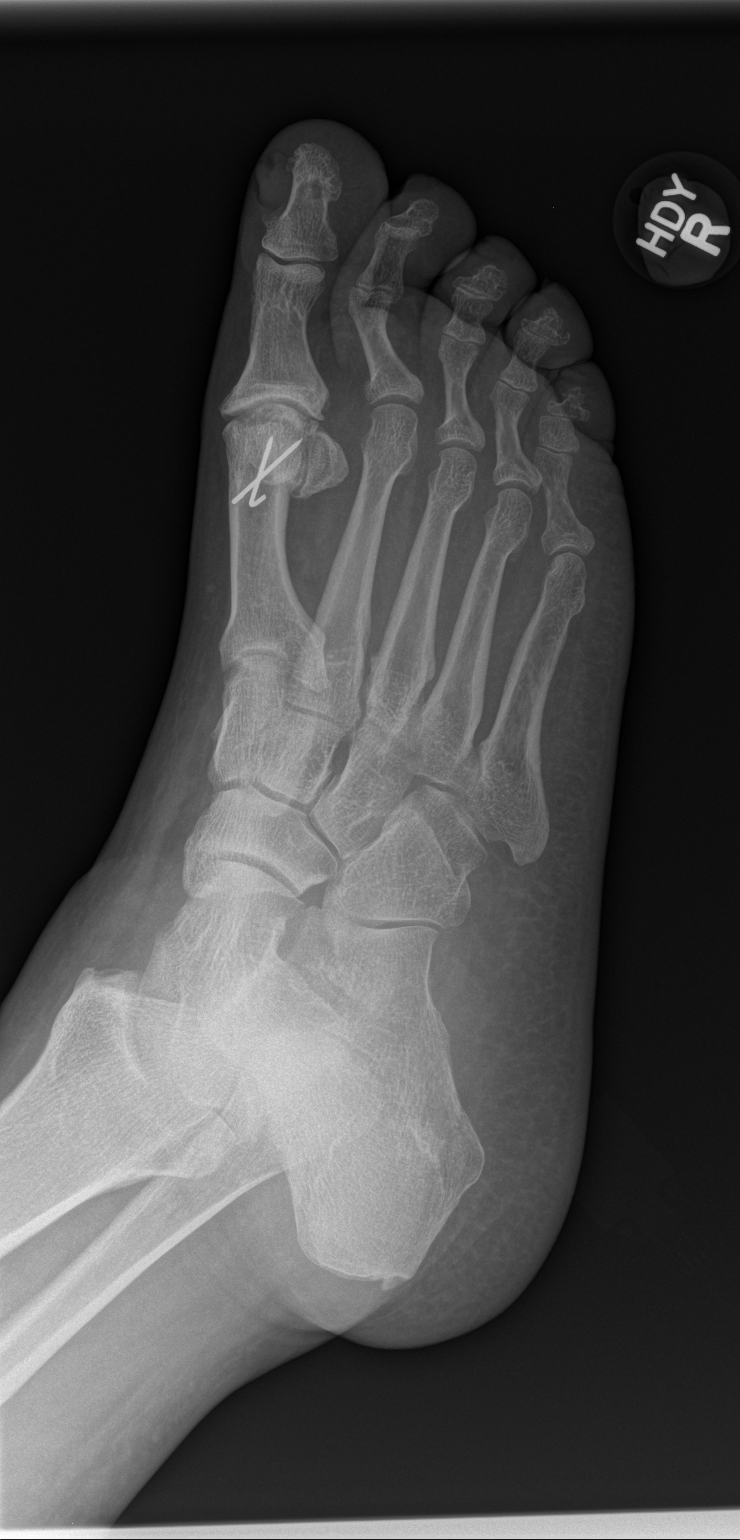
[im 3/3]
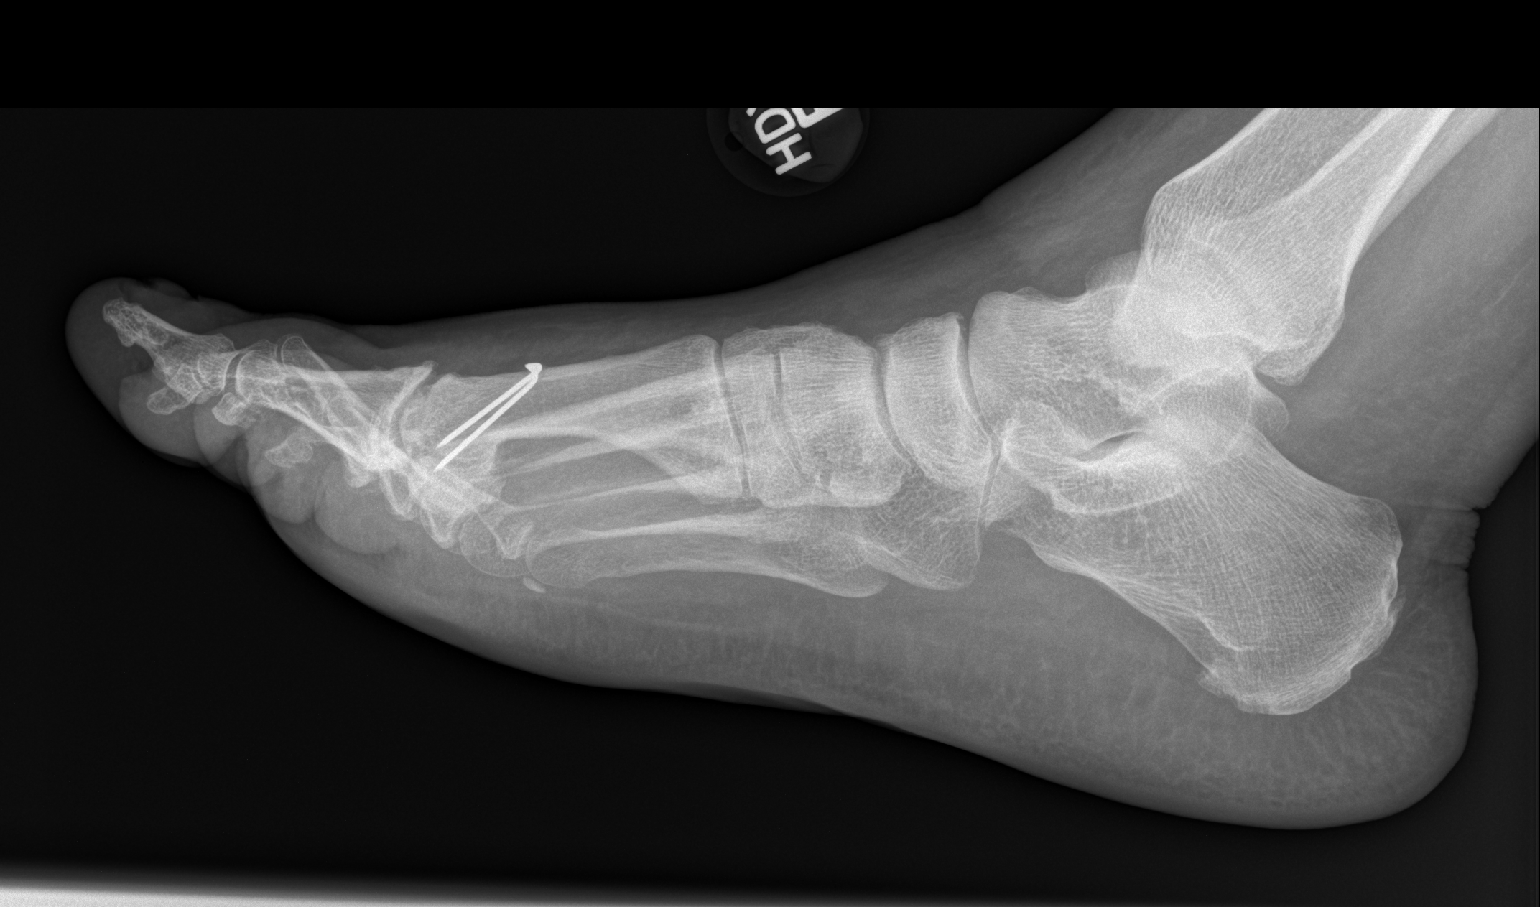

[3 of 3 positions shown; findings below may reference images not displayed]

FINDINGS: Three views of the right foot submitted. No acute fracture or
subluxation. Metallic fixation nails are noted distal aspect of
first metatarsal. Mild degenerative changes first metatarsal
phalangeal joint. Small plantar and posterior spur of calcaneus.
IMPRESSION: No acute fracture or subluxation. Postsurgical changes distal aspect
first metatarsal. Mild degenerative changes first metatarsal
phalangeal joint. Small plantar and posterior spur of calcaneus.

## 2015-11-14 NOTE — Patient Instructions (Signed)
Continue to monitor blood pressure at home and keep record. Notify me if persistently elevated. Return in D Semb her for physical exam. Xanax given for anxiety.

## 2015-11-14 NOTE — Progress Notes (Signed)
   Subjective:    Patient ID: Nicholas Flores, male    DOB: 1955-06-12, 60 y.o.   MRN: [id number]  HPI He is  here today to follow-up on elevated blood pressure noted at his dentist office a few weeks ago. He has purchased a home blood pressure cuff and brings in multiple blood pressure readings all of which are very acceptable. He went back to the dentist again  recently and had of recurrent elevated blood pressure. He feels well. No new complaints.    Review of Systems no headache, no dizziness, no palpitations     Objective:   Physical Exam  Skin warm and dry. Nodes none. Neck is supple without JVD thyromegaly or carotid bruits. Chest clear to auscultation without murmur. Cardiac exam regular rate and rhythm normal S1 and S2.      Assessment & Plan:  Elevated blood pressure-likely due to office hypertension and anxiety  Anxiety-given small prescription for alprazolam to take prickly before going to dental office  Plan: Patient will continue to monitor his blood pressure at home. I have given him acceptable ranges and parameters. If blood pressure remains persistently elevated, he is to get in touch with me.

## 2015-12-19 ENCOUNTER — Encounter: Payer: Self-pay | Admitting: Internal Medicine

## 2015-12-19 ENCOUNTER — Ambulatory Visit (INDEPENDENT_AMBULATORY_CARE_PROVIDER_SITE_OTHER): Payer: BC Managed Care – PPO | Admitting: Internal Medicine

## 2015-12-19 VITALS — BP 134/78 | HR 82 | Temp 98.0°F | Ht 68.0 in | Wt 249.0 lb

## 2015-12-19 DIAGNOSIS — H6502 Acute serous otitis media, left ear: Secondary | ICD-10-CM

## 2015-12-19 DIAGNOSIS — M26609 Unspecified temporomandibular joint disorder, unspecified side: Secondary | ICD-10-CM

## 2015-12-19 DIAGNOSIS — M26629 Arthralgia of temporomandibular joint, unspecified side: Secondary | ICD-10-CM

## 2015-12-19 DIAGNOSIS — M26659 Arthropathy of unspecified temporomandibular joint: Secondary | ICD-10-CM

## 2015-12-19 MED ORDER — MELOXICAM 15 MG PO TABS: 15.0000 mg | ORAL_TABLET | Freq: Every day | ORAL | 0 refills | Status: DC

## 2015-12-19 MED ORDER — AZITHROMYCIN 250 MG PO TABS: ORAL_TABLET | ORAL | 0 refills | Status: DC

## 2015-12-19 NOTE — Progress Notes (Signed)
   Subjective:    Patient ID: Nicholas Flores, male    DOB: 29-Apr-1956, 60 y.o.   MRN: [id number]  HPI Patient noticed a couple of days ago for discomfort in the left posterior pharynx and left side of his mouth. He thought he might have a nap this ulcer. Says he thought his left anterior cervical node was a bit swollen. No sore throat or URI symptoms. Has had some discomfort in his left ear and along the left TM joint. Has not been eating hard candy or chewing on tough meat. No history of TMJ syndrome.    Review of Systems see above     Objective:   Physical Exam  Inspection of left mouth does not show any at this ulcer or suspicious lesions. Left TM is full but not red. He is tender over his left temporomandibular joint. No significant adenopathy.      Assessment & Plan:  Left serous otitis media  Left TMJ syndrome  Plan: Meloxicam 15 mg daily for 2-4 weeks. May ice the left TM joint for 20 minutes once or twice daily. For serous otitis media, Zithromax Z-PAK take as directed

## 2015-12-19 NOTE — Patient Instructions (Signed)
Zithromax Z-PAK take 2 tablets day one followed by 1 tablet days 2 through 5 for serous otitis media. For TMJ syndrome Aloxi can 15 mg daily for 2-4 weeks until improved. May ice left TM joint for 20 minutes once or twice daily.

## 2016-01-06 ENCOUNTER — Encounter: Payer: Self-pay | Admitting: Podiatry

## 2016-01-06 ENCOUNTER — Ambulatory Visit (INDEPENDENT_AMBULATORY_CARE_PROVIDER_SITE_OTHER): Payer: BC Managed Care – PPO

## 2016-01-06 ENCOUNTER — Ambulatory Visit (INDEPENDENT_AMBULATORY_CARE_PROVIDER_SITE_OTHER): Payer: BC Managed Care – PPO | Admitting: Podiatry

## 2016-01-06 VITALS — BP 110/64 | HR 77 | Resp 12

## 2016-01-06 DIAGNOSIS — M79671 Pain in right foot: Secondary | ICD-10-CM

## 2016-01-06 DIAGNOSIS — M779 Enthesopathy, unspecified: Secondary | ICD-10-CM

## 2016-01-06 MED ORDER — TRIAMCINOLONE ACETONIDE 10 MG/ML IJ SUSP
10.0000 mg | Freq: Once | INTRAMUSCULAR | Status: AC
  Administered 2016-01-06: 10 mg

## 2016-01-06 NOTE — Progress Notes (Signed)
   Subjective:    Patient ID: Nicholas Flores, male    DOB: Sep 19, 1955, 60 y.o.   MRN: [id number]  HPI  Chief Complaint  Patient presents with  . Foot Pain    ''rt foot is looking much better but the ankle is swollen/painful for more than 1 year. ankle is worse when walking and tried boot, ice- no relief     Review of Systems     Objective:   Physical Exam        Assessment & Plan:

## 2016-01-09 NOTE — Progress Notes (Signed)
Subjective:     Patient ID: Nicholas Flores, male   DOB: March 11, 1956, 60 y.o.   MRN: [id number]  HPI patient presents stating is doing better but he is getting pain in his right foot still but it does not remember specific ankle injury. He points around the lateral ankle sinus tarsi mildly around the medial ankle and along the peroneal group   Review of Systems     Objective:   Physical Exam Inflammatory tendinitis with inflammation around the lateral peroneal group    Assessment:     H&P condition reviewed and carefully injected around sinus tarsi 3 mg Kenalog 5 mg Xylocaine and applied fascial brace to reduce pressure    Plan:     Probability for some form of capsulitis or tendinitis condition

## 2016-01-10 ENCOUNTER — Telehealth: Payer: Self-pay | Admitting: *Deleted

## 2016-01-10 NOTE — Telephone Encounter (Signed)
Pt states he would like another pair of orthotics and is not sure if he needs to be remolded.

## 2016-01-10 NOTE — Telephone Encounter (Signed)
Called patient he is requesting new orthotics.  As far as I can see in chart it has been over 3 years and he will need to be scanned and have a new Rx written at his next visit.  He will mention this to Dr Charlsie Merlesegal and I will add a note to the future appointment note

## 2016-01-20 ENCOUNTER — Encounter: Payer: Self-pay | Admitting: Podiatry

## 2016-01-20 ENCOUNTER — Ambulatory Visit (INDEPENDENT_AMBULATORY_CARE_PROVIDER_SITE_OTHER): Payer: BC Managed Care – PPO | Admitting: Podiatry

## 2016-01-20 DIAGNOSIS — M779 Enthesopathy, unspecified: Secondary | ICD-10-CM | POA: Diagnosis not present

## 2016-01-20 NOTE — Progress Notes (Signed)
Subjective:     Patient ID: Nicholas Flores, male   DOB: [birth year], 60 y.o.   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 Reappoint when they are returned

## 2016-02-10 ENCOUNTER — Ambulatory Visit (INDEPENDENT_AMBULATORY_CARE_PROVIDER_SITE_OTHER): Payer: BC Managed Care – PPO | Admitting: Podiatry

## 2016-02-10 DIAGNOSIS — M779 Enthesopathy, unspecified: Secondary | ICD-10-CM

## 2016-02-10 NOTE — Patient Instructions (Signed)

## 2016-02-12 NOTE — Progress Notes (Signed)
Subjective:     Patient ID: Romero BellingMorris W Piedra, male   DOB: [birth year], 60 y.o.   MRN: [id number]  HPI patient presents to pickup Orthotics   Review of Systems     Objective:   Physical Exam  Neurovascular status intact with discomfort in the heel    Assessment:     Tendinitis    Plan:     Orthotics dispensed fitted well

## 2016-03-08 ENCOUNTER — Other Ambulatory Visit: Payer: Self-pay | Admitting: *Deleted

## 2016-03-08 MED ORDER — MELOXICAM 15 MG PO TABS: 15.0000 mg | ORAL_TABLET | Freq: Every day | ORAL | 1 refills | Status: DC

## 2016-03-08 MED ORDER — SIMVASTATIN 10 MG PO TABS: 10.0000 mg | ORAL_TABLET | Freq: Every day | ORAL | 1 refills | Status: DC

## 2016-03-12 ENCOUNTER — Ambulatory Visit: Payer: BC Managed Care – PPO | Admitting: Podiatry

## 2016-03-16 ENCOUNTER — Ambulatory Visit (INDEPENDENT_AMBULATORY_CARE_PROVIDER_SITE_OTHER): Payer: BC Managed Care – PPO | Admitting: Internal Medicine

## 2016-03-16 DIAGNOSIS — Z23 Encounter for immunization: Secondary | ICD-10-CM | POA: Diagnosis not present

## 2016-04-27 ENCOUNTER — Other Ambulatory Visit: Payer: BC Managed Care – PPO | Admitting: Internal Medicine

## 2016-04-27 DIAGNOSIS — Z0000 Encounter for general adult medical examination without abnormal findings: Secondary | ICD-10-CM

## 2016-04-27 DIAGNOSIS — N401 Enlarged prostate with lower urinary tract symptoms: Secondary | ICD-10-CM

## 2016-04-27 DIAGNOSIS — E785 Hyperlipidemia, unspecified: Secondary | ICD-10-CM

## 2016-04-27 LAB — CBC WITH DIFFERENTIAL/PLATELET
Basophils Absolute: 38 cells/uL (ref 0–200)
Basophils Relative: 1 %
Eosinophils Absolute: 152 cells/uL (ref 15–500)
Eosinophils Relative: 4 %
HEMATOCRIT: 41.8 % (ref 38.5–50.0)
HEMOGLOBIN: 13.8 g/dL (ref 13.2–17.1)
LYMPHS ABS: 1216 {cells}/uL (ref 850–3900)
LYMPHS PCT: 32 %
MCH: 29.5 pg (ref 27.0–33.0)
MCHC: 33 g/dL (ref 32.0–36.0)
MCV: 89.3 fL (ref 80.0–100.0)
MONO ABS: 380 {cells}/uL (ref 200–950)
MPV: 9.1 fL (ref 7.5–12.5)
Monocytes Relative: 10 %
NEUTROS PCT: 53 %
Neutro Abs: 2014 cells/uL (ref 1500–7800)
Platelets: 192 10*3/uL (ref 140–400)
RBC: 4.68 MIL/uL (ref 4.20–5.80)
RDW: 15 % (ref 11.0–15.0)
WBC: 3.8 10*3/uL (ref 3.8–10.8)

## 2016-04-27 LAB — COMPLETE METABOLIC PANEL WITH GFR
ALT: 30 U/L (ref 9–46)
AST: 30 U/L (ref 10–35)
Albumin: 4.3 g/dL (ref 3.6–5.1)
Alkaline Phosphatase: 56 U/L (ref 40–115)
BUN: 18 mg/dL (ref 7–25)
CHLORIDE: 104 mmol/L (ref 98–110)
CO2: 25 mmol/L (ref 20–31)
CREATININE: 1.1 mg/dL (ref 0.70–1.25)
Calcium: 9.4 mg/dL (ref 8.6–10.3)
GFR, Est African American: 84 mL/min (ref >=60)
GFR, Est Non African American: 73 mL/min (ref >=60)
GLUCOSE: 88 mg/dL (ref 65–99)
POTASSIUM: 4.4 mmol/L (ref 3.5–5.3)
Sodium: 140 mmol/L (ref 135–146)
Total Bilirubin: 0.5 mg/dL (ref 0.2–1.2)
Total Protein: 7 g/dL (ref 6.1–8.1)

## 2016-04-27 LAB — LIPID PANEL
CHOL/HDL RATIO: 2.9 ratio (ref <5.0)
Cholesterol: 160 mg/dL (ref <200)
HDL: 55 mg/dL (ref >40)
LDL CALC: 94 mg/dL (ref <100)
Triglycerides: 57 mg/dL (ref <150)
VLDL: 11 mg/dL (ref <30)

## 2016-04-28 LAB — PSA: PSA: 1 ng/mL (ref <=4.0)

## 2016-04-30 ENCOUNTER — Ambulatory Visit (INDEPENDENT_AMBULATORY_CARE_PROVIDER_SITE_OTHER): Payer: BC Managed Care – PPO | Admitting: Internal Medicine

## 2016-04-30 ENCOUNTER — Encounter: Payer: Self-pay | Admitting: Internal Medicine

## 2016-04-30 VITALS — BP 128/88 | HR 85 | Temp 97.8°F | Ht 68.0 in | Wt 265.0 lb

## 2016-04-30 DIAGNOSIS — E7849 Other hyperlipidemia: Secondary | ICD-10-CM

## 2016-04-30 DIAGNOSIS — F411 Generalized anxiety disorder: Secondary | ICD-10-CM | POA: Diagnosis not present

## 2016-04-30 DIAGNOSIS — Z8669 Personal history of other diseases of the nervous system and sense organs: Secondary | ICD-10-CM | POA: Diagnosis not present

## 2016-04-30 DIAGNOSIS — M7918 Myalgia, other site: Secondary | ICD-10-CM

## 2016-04-30 DIAGNOSIS — E784 Other hyperlipidemia: Secondary | ICD-10-CM

## 2016-04-30 DIAGNOSIS — Z0000 Encounter for general adult medical examination without abnormal findings: Secondary | ICD-10-CM | POA: Diagnosis not present

## 2016-04-30 LAB — POCT URINALYSIS DIPSTICK
BILIRUBIN UA: NEGATIVE
GLUCOSE UA: NEGATIVE
Ketones, UA: NEGATIVE
Leukocytes, UA: NEGATIVE
Nitrite, UA: NEGATIVE
PH UA: 6
Protein, UA: NEGATIVE
RBC UA: NEGATIVE
Spec Grav, UA: 1.015
UROBILINOGEN UA: NEGATIVE

## 2016-04-30 MED ORDER — HYDROCODONE-ACETAMINOPHEN 5-325 MG PO TABS: 1.0000 | ORAL_TABLET | ORAL | 0 refills | Status: DC | PRN

## 2016-04-30 MED ORDER — ZOSTER VAC RECOMB ADJUVANTED 50 MCG/0.5ML IM SUSR: 50.0000 ug | Freq: Once | INTRAMUSCULAR | 0 refills | Status: AC

## 2016-04-30 NOTE — Progress Notes (Signed)
Subjective:    Patient ID: Nicholas Flores, male    DOB: 1956-03-11, 60 y.o.   MRN: [id number]  HPI  Pleasant 60 year old Black Male in today for  health maintenance exam and evaluation of medical issues. He has a history of hyperlipidemia. He is status post a left quadriceps tendon repair, partial lateral meniscectomy and chondroplasty February 2016 by Dr. Garvin FilaLyman Smith at [facility]. after [place]. Also developed some right ankle pain which was seen and treated at [facility] May 2016. Patient thought this was due to bearing more weight on the right lower extremity after left knee surgery.  History of migraine headaches treated with Excedrin Migraine and occasional hydrocodone/APAP. Received flu vaccine at work.  BPH treated with Flomax per urologist.  He is allergic penicillin.  Past medical history: History of lactose intolerance and irritable bowel syndrome. Right bunionectomy in the mid 1990s by Dr. Dellia Nimsiegel. History of toenail fungus treated by Dr. Dellia Nimsiegel.  Records from [facility] indicate patient is status post tonsillectomy in the remote past and has a prior history of gout.  Social history: He completed 4 years of college. He is single. 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 He used to smoke a half pack of cigarettes daily for some 12 years but quit some 17 years ago. He drinks red wine daily. Generally has about 8 ounces a day and has done this for about 20 years. He has tried working out in the past but with recent knee issues has not been able to do that very well. Tries to walk some.  Family history: Twin brother in good health. Mother living in her 6180s with history of stroke.  Evaluated for chest pain in May 2015 at [facility] in [place]. See care everywhere for details.   Review of Systems noncontributory      Objective:   Physical Exam  Constitutional: He is oriented to person,  place, and time. He appears well-developed and well-nourished. No distress.  HENT:  Head: Normocephalic and atraumatic.  Right Ear: External ear normal.  Left Ear: External ear normal.  Mouth/Throat: Oropharynx is clear and moist. No oropharyngeal exudate.  Eyes: Conjunctivae and EOM are normal. Pupils are equal, round, and reactive to light. Right eye exhibits no discharge. Left eye exhibits no discharge. No scleral icterus.  Neck: Neck supple. No JVD present. No thyromegaly present.  Cardiovascular: Normal rate, regular rhythm, normal heart sounds and intact distal pulses.   No murmur heard. Pulmonary/Chest: Effort normal and breath sounds normal. No respiratory distress. He has no wheezes. He has no rales. He exhibits no tenderness.  Abdominal: Soft. Bowel sounds are normal. He exhibits no distension and no mass. There is no tenderness. There is no rebound and no guarding.  Genitourinary: Prostate normal.  Musculoskeletal: He exhibits no edema.  Lymphadenopathy:    He has no cervical adenopathy.  Neurological: He is alert and oriented to person, place, and time. He has normal reflexes. No cranial nerve deficit. Coordination normal.  Skin: Skin is warm and dry. No rash noted. He is not diaphoretic.  Psychiatric: He has a normal mood and affect. His behavior is normal. Judgment and thought content normal.  Vitals reviewed.         Assessment & Plan:  Hyperlipidemia  History of migraine headaches  Status post left knee surgery secondary to injury from a fall  BPH treated with Flomax by urologist  Plan: Continue same medications and return in 6 months. Blood pressure is acceptable. Encouraged diet exercise and weight loss. May take meloxicam 15 mg daily for musculoskeletal pain. Refill hydrocodone/APAP 5/325 one by mouth every 4 hours when necessary migraine headache pain #40 no refill. He uses these sparingly.

## 2016-05-19 NOTE — Patient Instructions (Signed)
It was a pleasure to see you today.  Continue same medications and return in 6 months. 

## 2016-08-03 ENCOUNTER — Other Ambulatory Visit: Payer: Self-pay | Admitting: Internal Medicine

## 2016-09-10 ENCOUNTER — Other Ambulatory Visit: Payer: Self-pay | Admitting: Internal Medicine

## 2016-10-26 ENCOUNTER — Other Ambulatory Visit: Payer: BC Managed Care – PPO | Admitting: Internal Medicine

## 2016-10-26 DIAGNOSIS — E7849 Other hyperlipidemia: Secondary | ICD-10-CM

## 2016-10-26 LAB — HEPATIC FUNCTION PANEL
ALT: 28 U/L (ref 9–46)
AST: 25 U/L (ref 10–35)
Albumin: 4 g/dL (ref 3.6–5.1)
Alkaline Phosphatase: 56 U/L (ref 40–115)
BILIRUBIN TOTAL: 0.5 mg/dL (ref 0.2–1.2)
Bilirubin, Direct: 0.1 mg/dL (ref <=0.2)
Indirect Bilirubin: 0.4 mg/dL (ref 0.2–1.2)
Total Protein: 6.5 g/dL (ref 6.1–8.1)

## 2016-10-26 LAB — LIPID PANEL
Cholesterol: 158 mg/dL (ref <200)
HDL: 46 mg/dL (ref >40)
LDL Cholesterol: 100 mg/dL — ABNORMAL HIGH (ref <100)
Total CHOL/HDL Ratio: 3.4 Ratio (ref <5.0)
Triglycerides: 61 mg/dL (ref <150)
VLDL: 12 mg/dL (ref <30)

## 2016-10-29 ENCOUNTER — Ambulatory Visit: Payer: BC Managed Care – PPO | Admitting: Internal Medicine

## 2016-10-29 ENCOUNTER — Encounter: Payer: Self-pay | Admitting: Internal Medicine

## 2016-10-29 ENCOUNTER — Ambulatory Visit (INDEPENDENT_AMBULATORY_CARE_PROVIDER_SITE_OTHER): Payer: BC Managed Care – PPO | Admitting: Internal Medicine

## 2016-10-29 VITALS — BP 102/60 | HR 78 | Temp 97.3°F | Wt 258.0 lb

## 2016-10-29 DIAGNOSIS — E784 Other hyperlipidemia: Secondary | ICD-10-CM

## 2016-10-29 DIAGNOSIS — E7849 Other hyperlipidemia: Secondary | ICD-10-CM

## 2016-10-29 NOTE — Progress Notes (Signed)
   Subjective:    Patient ID: Nicholas Flores, male    DOB: 1955-09-04, 61 y.o.   MRN: [id number]  HPI Pleasant 61 year-old Black Male in today for six-month recheck. History of hyperlipidemia controlled with Zocor 10 mg daily. He has had some intermittent blood pressure elevations from time to time. Blood pressure check today is normal. History of BPH treated with Flomax. Occasionally takes Xanax for anxiety. He feels well with no new complaints. Continues to work as Astronomerhairdressing instructor at [employer] Blood pressure is excellent today at 102/60   Review of Systems see above     Objective:   Physical Exam Skin warm and dry. Nodes none. Neck is supple without JVD thyromegaly or carotid bruits. Chest is clear to auscultation without rales or wheezing. Cardiac exam regular rate and rhythm normal S1 and S2. Extremities without edema. Review of lipid panel shows total cholesterol to be 158, triglycerides 61, HDL cholesterol 46 and LDL cholesterol 100. Liver functions are normal.      Assessment & Plan:  Hyperlipidemia-lipid panel stable on current dose of statin medication. Liver functions are normal.  Plan: Continue statin medication and return in 6 months for physical examination.

## 2016-11-17 NOTE — Patient Instructions (Signed)
It was a pleasure to see you today. Continue same medications and return in 6 months for physical examination.

## 2017-01-18 ENCOUNTER — Encounter: Payer: Self-pay | Admitting: Internal Medicine

## 2017-03-11 ENCOUNTER — Other Ambulatory Visit: Payer: Self-pay | Admitting: Internal Medicine

## 2017-04-24 ENCOUNTER — Other Ambulatory Visit: Payer: Self-pay | Admitting: Internal Medicine

## 2017-04-24 DIAGNOSIS — N400 Enlarged prostate without lower urinary tract symptoms: Secondary | ICD-10-CM

## 2017-04-24 DIAGNOSIS — E785 Hyperlipidemia, unspecified: Secondary | ICD-10-CM

## 2017-04-24 DIAGNOSIS — Z0000 Encounter for general adult medical examination without abnormal findings: Secondary | ICD-10-CM

## 2017-04-26 ENCOUNTER — Other Ambulatory Visit: Payer: BC Managed Care – PPO | Admitting: Internal Medicine

## 2017-04-28 ENCOUNTER — Other Ambulatory Visit: Payer: Self-pay | Admitting: Internal Medicine

## 2017-04-29 ENCOUNTER — Other Ambulatory Visit: Payer: BC Managed Care – PPO | Admitting: Internal Medicine

## 2017-05-01 NOTE — Addendum Note (Signed)
Addended by: Gill Delrossi P on: 05/01/2017 03:46 PM   Modules accepted: Orders  

## 2017-05-01 NOTE — Addendum Note (Signed)
Addended by: Gregery NaVALENCIA, Kandance Yano P on: 05/01/2017 03:46 PM   Modules accepted: Orders

## 2017-05-01 NOTE — Addendum Note (Signed)
Addended by: Misaki Sozio P on: 05/01/2017 03:46 PM   Modules accepted: Orders  

## 2017-05-02 ENCOUNTER — Other Ambulatory Visit (INDEPENDENT_AMBULATORY_CARE_PROVIDER_SITE_OTHER): Payer: BC Managed Care – PPO | Admitting: Internal Medicine

## 2017-05-02 DIAGNOSIS — E785 Hyperlipidemia, unspecified: Secondary | ICD-10-CM

## 2017-05-02 DIAGNOSIS — N400 Enlarged prostate without lower urinary tract symptoms: Secondary | ICD-10-CM

## 2017-05-02 DIAGNOSIS — Z0000 Encounter for general adult medical examination without abnormal findings: Secondary | ICD-10-CM

## 2017-05-02 LAB — COMPLETE METABOLIC PANEL WITH GFR
AG Ratio: 1.5 (calc) (ref 1.0–2.5)
ALT: 32 U/L (ref 9–46)
AST: 32 U/L (ref 10–35)
Albumin: 4.1 g/dL (ref 3.6–5.1)
Alkaline phosphatase (APISO): 59 U/L (ref 40–115)
BUN: 18 mg/dL (ref 7–25)
CALCIUM: 9.4 mg/dL (ref 8.6–10.3)
CO2: 26 mmol/L (ref 20–32)
CREATININE: 1.23 mg/dL (ref 0.70–1.25)
Chloride: 103 mmol/L (ref 98–110)
GFR, EST AFRICAN AMERICAN: 73 mL/min/{1.73_m2} (ref >=60)
GFR, EST NON AFRICAN AMERICAN: 63 mL/min/{1.73_m2} (ref >=60)
Globulin: 2.8 g/dL (calc) (ref 1.9–3.7)
Glucose, Bld: 91 mg/dL (ref 65–99)
POTASSIUM: 4.4 mmol/L (ref 3.5–5.3)
Sodium: 138 mmol/L (ref 135–146)
TOTAL PROTEIN: 6.9 g/dL (ref 6.1–8.1)
Total Bilirubin: 0.4 mg/dL (ref 0.2–1.2)

## 2017-05-02 LAB — CBC WITH DIFFERENTIAL/PLATELET
BASOS PCT: 0.6 %
Basophils Absolute: 29 cells/uL (ref 0–200)
Eosinophils Absolute: 158 cells/uL (ref 15–500)
Eosinophils Relative: 3.3 %
HCT: 39.7 % (ref 38.5–50.0)
Hemoglobin: 13.4 g/dL (ref 13.2–17.1)
LYMPHS ABS: 1517 {cells}/uL (ref 850–3900)
MCH: 29.5 pg (ref 27.0–33.0)
MCHC: 33.8 g/dL (ref 32.0–36.0)
MCV: 87.4 fL (ref 80.0–100.0)
MPV: 9.8 fL (ref 7.5–12.5)
Monocytes Relative: 15.1 %
Neutro Abs: 2371 cells/uL (ref 1500–7800)
Neutrophils Relative %: 49.4 %
PLATELETS: 196 10*3/uL (ref 140–400)
RBC: 4.54 10*6/uL (ref 4.20–5.80)
RDW: 13.8 % (ref 11.0–15.0)
TOTAL LYMPHOCYTE: 31.6 %
WBC: 4.8 10*3/uL (ref 3.8–10.8)
WBCMIX: 725 {cells}/uL (ref 200–950)

## 2017-05-02 LAB — LIPID PANEL
CHOLESTEROL: 171 mg/dL (ref <200)
HDL: 62 mg/dL (ref >40)
LDL CHOLESTEROL (CALC): 95 mg/dL
Non-HDL Cholesterol (Calc): 109 mg/dL (calc) (ref <130)
TRIGLYCERIDES: 62 mg/dL (ref <150)
Total CHOL/HDL Ratio: 2.8 (calc) (ref <5.0)

## 2017-05-02 LAB — PSA: PSA: 1.2 ng/mL (ref <=4.0)

## 2017-05-03 ENCOUNTER — Ambulatory Visit (INDEPENDENT_AMBULATORY_CARE_PROVIDER_SITE_OTHER): Payer: BC Managed Care – PPO | Admitting: Internal Medicine

## 2017-05-03 ENCOUNTER — Encounter: Payer: Self-pay | Admitting: Internal Medicine

## 2017-05-03 VITALS — BP 102/68 | HR 103 | Ht 66.25 in | Wt 272.0 lb

## 2017-05-03 DIAGNOSIS — N401 Enlarged prostate with lower urinary tract symptoms: Secondary | ICD-10-CM | POA: Diagnosis not present

## 2017-05-03 DIAGNOSIS — Z0000 Encounter for general adult medical examination without abnormal findings: Secondary | ICD-10-CM | POA: Diagnosis not present

## 2017-05-03 DIAGNOSIS — R351 Nocturia: Secondary | ICD-10-CM

## 2017-05-03 DIAGNOSIS — Z8669 Personal history of other diseases of the nervous system and sense organs: Secondary | ICD-10-CM

## 2017-05-03 DIAGNOSIS — F419 Anxiety disorder, unspecified: Secondary | ICD-10-CM

## 2017-05-03 DIAGNOSIS — E7849 Other hyperlipidemia: Secondary | ICD-10-CM

## 2017-05-03 LAB — POCT URINALYSIS DIPSTICK
APPEARANCE: NORMAL
Bilirubin, UA: NEGATIVE
Blood, UA: NEGATIVE
Glucose, UA: NEGATIVE
Ketones, UA: NEGATIVE
LEUKOCYTES UA: NEGATIVE
NITRITE UA: NEGATIVE
Odor: NORMAL
PROTEIN UA: NEGATIVE
Spec Grav, UA: 1.015 (ref 1.010–1.025)
UROBILINOGEN UA: 0.2 U/dL
pH, UA: 6.5 (ref 5.0–8.0)

## 2017-05-03 MED ORDER — HYDROCODONE-ACETAMINOPHEN 10-325 MG PO TABS: 1.0000 | ORAL_TABLET | ORAL | 0 refills | Status: DC | PRN

## 2017-05-03 NOTE — Progress Notes (Signed)
Subjective:    Patient ID: Nicholas Flores, male    DOB: 11-01-1955, 61 y.o.   MRN: [id number]  HPI   61 year old Black Male for health maintenance exam and evaluation of medical issues.   Has gained 7 pounds since last CPE Dec 2017.  BP is excellent.  Lipid panel normal on statin.  History of hyperlipidemia.  Still on Flomax for BPH per urologist  He is allergic to penicillin  Past medical history: History of lactose intolerance and irritable bowel syndrome.  Right bunionectomy by Dr. Charlsie Merlesegal in the mid 1990s.  History of toenail fungus.  [facility] indicate patient is status post tonsillectomy in the remote past and has a prior history of gout.  Social history: He completed 4 years of college.  He is single.  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  He used to smoke 1/2 pack of cigarettes daily for some 12 years but quit some 18 years ago.  He drinks red wine daily.  Generally has 8 ounces a day of red wine and has done this for about 20 years.  Tries to walk some but really does not work out.  Family history: Twin brother in good health.  They enjoy going on cruises together.  Mother living in her 7780s with history of stroke.  Evaluated for chest pain May 2015 at [facility] [place].  See Care Everywhere for details  Headaches more than usual.  Situational stress at work.  He had a quadriceps tendon repair, partial lateral meniscectomy and chondroplasty February 2016 by Dr. Garvin FilaLyman Smith at [facility] after a [place].  He also developed some right ankle pain and had to be seen at [facility] May 2016.  It was thought this was due more to weightbearing on the right lower extremity after his left knee surgery.  Migraines are treated with Excedrin Migraine and occasional hydrocodone APAP.  Received flu vaccine at work.        Review of Systems  Constitutional: Negative.     HENT: Negative.   Respiratory: Negative.   Cardiovascular: Negative.   Gastrointestinal: Negative.   Genitourinary:       Hx nocturia  Neurological: Positive for headaches.  Psychiatric/Behavioral: Negative.        Objective:   Physical Exam  Constitutional: He is oriented to person, place, and time. He appears well-developed and well-nourished. No distress.  HENT:  Head: Normocephalic and atraumatic.  Right Ear: External ear normal.  Left Ear: External ear normal.  Mouth/Throat: Oropharynx is clear and moist.  Eyes: Conjunctivae and EOM are normal. Pupils are equal, round, and reactive to light. Right eye exhibits no discharge. Left eye exhibits no discharge.  Neck: Neck supple. No JVD present. No thyromegaly present.  Cardiovascular: Normal rate, regular rhythm and normal heart sounds.  No murmur heard. Pulmonary/Chest: Effort normal and breath sounds normal. No respiratory distress. He has no wheezes. He has no rales. He exhibits no tenderness.  Abdominal: Soft. Bowel sounds are normal. He exhibits no distension and no mass. There is no tenderness. There is no rebound and no guarding.  Genitourinary: Prostate normal.  Musculoskeletal: He exhibits no edema.  Lymphadenopathy:    He has no cervical adenopathy.  Neurological: He is alert and oriented to person, place, and time. He has normal reflexes. No cranial nerve deficit. Coordination normal.  Skin: Skin is warm and dry. No rash  noted. He is not diaphoretic.  Psychiatric: He has a normal mood and affect. His behavior is normal. Judgment and thought content normal.  Vitals reviewed.         Assessment & Plan:  History of migraine headaches  Hyperlipidemia  BMI 43-may want to see Dr. Dalbert GarnetBeasley for weight management  Hyperlipidemia treated with statin  BPH  Allergic rhinitis  Anxiety treated with as needed Xanax  Status post left knee surgery secondary to injury from a fall  Plan: Continue same medications and  return in 6 months.

## 2017-05-20 NOTE — Patient Instructions (Addendum)
It was a pleasure to see you today.  Please try to lose some weight.  Continue same medications and return in 6 months.  Consider consult with Dr. Dalbert GarnetBeasley for weight management.

## 2017-05-31 ENCOUNTER — Ambulatory Visit: Payer: BC Managed Care – PPO | Admitting: Podiatry

## 2017-06-07 ENCOUNTER — Ambulatory Visit: Payer: BC Managed Care – PPO | Admitting: Podiatry

## 2017-06-07 ENCOUNTER — Encounter: Payer: Self-pay | Admitting: Podiatry

## 2017-06-07 DIAGNOSIS — Q828 Other specified congenital malformations of skin: Secondary | ICD-10-CM

## 2017-06-10 NOTE — Progress Notes (Signed)
Subjective:   Patient ID: Nicholas Flores, male   DOB: 62 y.o.   MRN: [id number]   HPI Patient presents with chronic plantar lesions of both feet   ROS      Objective:  Physical Exam  Neurovascular status intact with significant keratotic lesions plantar aspect bilateral that are painful     Assessment:  Porokeratotic type lesions plantar bilateral sharp     Plan:  Debridement accomplished with no iatrogenic bleeding padding with medication to try to soften the lesions and reappoint as needed

## 2017-07-17 ENCOUNTER — Encounter (INDEPENDENT_AMBULATORY_CARE_PROVIDER_SITE_OTHER): Payer: BC Managed Care – PPO

## 2017-07-23 ENCOUNTER — Encounter (INDEPENDENT_AMBULATORY_CARE_PROVIDER_SITE_OTHER): Payer: Self-pay | Admitting: Family Medicine

## 2017-07-23 ENCOUNTER — Ambulatory Visit (INDEPENDENT_AMBULATORY_CARE_PROVIDER_SITE_OTHER): Payer: BC Managed Care – PPO | Admitting: Family Medicine

## 2017-07-23 VITALS — BP 128/75 | HR 74 | Temp 97.8°F | Ht 67.0 in | Wt 263.0 lb

## 2017-07-23 DIAGNOSIS — R0602 Shortness of breath: Secondary | ICD-10-CM

## 2017-07-23 DIAGNOSIS — Z9189 Other specified personal risk factors, not elsewhere classified: Secondary | ICD-10-CM | POA: Diagnosis not present

## 2017-07-23 DIAGNOSIS — I517 Cardiomegaly: Secondary | ICD-10-CM | POA: Diagnosis not present

## 2017-07-23 DIAGNOSIS — Z1331 Encounter for screening for depression: Secondary | ICD-10-CM

## 2017-07-23 DIAGNOSIS — R5383 Other fatigue: Secondary | ICD-10-CM

## 2017-07-23 DIAGNOSIS — Z6841 Body Mass Index (BMI) 40.0 and over, adult: Secondary | ICD-10-CM

## 2017-07-23 DIAGNOSIS — Z0289 Encounter for other administrative examinations: Secondary | ICD-10-CM

## 2017-07-23 DIAGNOSIS — E7849 Other hyperlipidemia: Secondary | ICD-10-CM

## 2017-07-23 NOTE — Progress Notes (Signed)
Office: [phone]  /  Fax: [phone]   Dear Dr. Lenord Fellers,   Thank you for referring Nicholas Flores to our clinic. The following note includes my evaluation and treatment recommendations.  HPI:   Chief Complaint: OBESITY    Nicholas Flores has been referred by Luanna Cole. Lenord Fellers, MD for consultation regarding his obesity and obesity related comorbidities.    Nicholas Flores (MR# [id number]) is a 62 y.o. male who presents on 07/23/2017 for obesity evaluation and treatment. Current BMI is Body mass index is 41.19 kg/m.Marland Kitchen Nicholas Flores has been struggling with his weight for many years and has been unsuccessful in either losing weight, maintaining weight loss, or reaching his healthy weight goal.     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      Nicholas Flores attended our information session and states he is currently in the action stage of change and ready to dedicate time achieving and maintaining a healthier weight. Nicholas Flores is interested in becoming our patient and working on intensive lifestyle modifications including (but not limited to) diet, exercise and weight loss.    Nicholas Flores states his desired weight loss is 63 lbs he has been heavy most of  his life he started gaining weight various times his heaviest weight ever was 268 lbs he has significant food cravings issues  he snacks frequently in the evenings he skips meals frequently he is frequently drinking liquids with calories he frequently makes poor food choices he frequently eats larger portions than normal  he struggles with emotional eating    Fatigue Nicholas Flores feels his energy is lower than it should be. This has worsened with weight gain and has not worsened recently. Nicholas Flores admits to daytime somnolence and  admits to waking up still tired. Patient is at risk for obstructive sleep apnea. Patent has a history of symptoms of daytime fatigue. Patient generally gets 5 hours of sleep per night,  and states they generally have generally restful sleep. Snoring is present. Apneic episodes are not present. Epworth Sleepiness Score is 17.  Dyspnea on exertion Nicholas Flores notes increasing shortness of breath with exercising and seems to be worsening over time with weight gain. He notes getting out of breath sooner with activity than he used to. This has not gotten worse recently. Nicholas Flores denies orthopnea.  Hyperlipidemia Nicholas Flores has hyperlipidemia and on simvastatin and would like to diet control to improve his cholesterol levels with intensive lifestyle modification including a low saturated fat diet, exercise and weight loss. He denies any chest pain, claudication or myalgias.  Left Ventricular Hypertrophy on EKG Nicholas Flores has left ventricular hypertrophy with 1+ pitting edema and shortness of breath with exercise. He denies chest pain or orthopnea  At risk for cardiovascular disease Nicholas Flores is at a higher than average risk for cardiovascular disease due to obesity, hyperlipidemia, and LVH. He currently denies any chest pain.  Depression Screen Nicholas Flores Food and Mood (modified PHQ-9) score was  Depression screen PHQ 2/9 07/23/2017  Decreased Interest 1  Down, Depressed, Hopeless 0  PHQ - 2 Score 1  Altered sleeping 0  Tired, decreased energy 1  Change in appetite 1  Feeling bad or failure about yourself  0  Trouble concentrating 0  Moving slowly or fidgety/restless 0  Suicidal thoughts 0  PHQ-9 Score 3  Difficult doing work/chores Not difficult at all    ALLERGIES: Allergies  Allergen Reactions  . Penicillins Rash  . Botox [Botulinum Toxin Type  A]     Blurred vision    MEDICATIONS: Current Outpatient Medications on File Prior to Visit  Medication Sig Dispense Refill  . Blood Pressure Monitoring (BLOOD PRESSURE MONITOR 7) DEVI U UTD  0  . HYDROcodone-acetaminophen (NORCO) 10-325 MG tablet Take 1 tablet by mouth every 4 (four) hours as needed. 30 tablet 0  . mometasone (NASONEX) 50  MCG/ACT nasal spray USE 2 SPRAYS EACH NOSTRIL EVERY DAY AS NEEDED 17 g 11  . simvastatin (ZOCOR) 10 MG tablet TAKE 1 TABLET (10 MG TOTAL) BY MOUTH AT BEDTIME. 90 tablet 1  . SYSTANE ULTRA PF 0.4-0.3 % SOLN INSTILL ONE DROP INTO RIGHT EYE 4 TIMES A DAY  4  . tamsulosin (FLOMAX) 0.4 MG CAPS capsule TAKE 1 CAPSULE (0.4 MG TOTAL) BY MOUTH 2 (TWO) TIMES DAILY. 180 capsule 3   No current facility-administered medications on file prior to visit.     PAST MEDICAL HISTORY: Past Medical History:  Diagnosis Date  . BPH (benign prostatic hyperplasia)   . Migraines   . Over weight     PAST SURGICAL HISTORY: Past Surgical History:  Procedure Laterality Date  . BUNIONECTOMY    . Left knee surgery    . Left quadricep surgery    . TONSILLECTOMY      SOCIAL HISTORY: Social History   Tobacco Use  . Smoking status: Former Smoker    Types: Cigars  . Smokeless tobacco: Never Used  Substance Use Topics  . Alcohol use: Yes    Alcohol/week: 7.8 oz    Types: 1 Glasses of wine, 12 Standard drinks or equivalent per week  . Drug use: No    FAMILY HISTORY: Family History  Problem Relation Age of Onset  . Stroke Mother   . Diabetes Mother   . Hypertension Mother   . Obesity Mother     ROS: Review of Systems  Constitutional: Positive for malaise/fatigue. Negative for weight loss.  HENT: Positive for sinus pain.        + Nasal stuffiness  Eyes:       + Wear glasses or contacts + Floaters  Respiratory: Positive for shortness of breath (with exertion).   Cardiovascular: Negative for chest pain, orthopnea and claudication.  Musculoskeletal: Negative for myalgias.  Skin:       + Dryness  Psychiatric/Behavioral: Positive for depression. Negative for suicidal ideas.       + Stress    PHYSICAL EXAM: Blood pressure 128/75, pulse 74, temperature 97.8 F (36.6 C), temperature source Oral, height 5\' 7"  (1.702 m), weight 263 lb (119.3 kg), SpO2 94 %. Body mass index is 41.19 kg/m. Physical  Exam  Constitutional: He is oriented to person, place, and time. He appears well-developed and well-nourished.  HENT:  Head: Normocephalic and atraumatic.  Nose: Nose normal.  Eyes: EOM are normal. No scleral icterus.  Neck: Normal range of motion. Neck supple. No thyromegaly present.  Cardiovascular: Normal rate and regular rhythm.  Pulmonary/Chest: Effort normal. No respiratory distress.  Abdominal: Soft. There is no tenderness.  + Obesity  Musculoskeletal:  Range of Motion normal in all 4 extremities 1+ pitting edema noted in bilateral lower extremities  Neurological: He is alert and oriented to person, place, and time. Coordination normal.  Skin: Skin is warm and dry.  Psychiatric: He has a normal mood and affect. His behavior is normal.  Vitals reviewed.   RECENT LABS AND TESTS: BMET    Component Value Date/Time   NA 138 05/02/2017 0945  NA 142 08/18/2011 0527   K 4.4 05/02/2017 0945   K 3.5 08/18/2011 0527   CL 103 05/02/2017 0945   CL 110 (H) 08/18/2011 0527   CO2 26 05/02/2017 0945   CO2 21 08/18/2011 0527   GLUCOSE 91 05/02/2017 0945   GLUCOSE 86 08/18/2011 0527   BUN 18 05/02/2017 0945   BUN 13 08/18/2011 0527   CREATININE 1.23 05/02/2017 0945   CALCIUM 9.4 05/02/2017 0945   CALCIUM 7.8 (L) 08/18/2011 0527   GFRNONAA 63 05/02/2017 0945   GFRAA 73 05/02/2017 0945   No results found for: HGBA1C No results found for: INSULIN CBC    Component Value Date/Time   WBC 4.8 05/02/2017 0945   RBC 4.54 05/02/2017 0945   HGB 13.4 05/02/2017 0945   HGB 12.4 (L) 08/18/2011 0527   HCT 39.7 05/02/2017 0945   HCT 36.9 (L) 08/18/2011 0527   PLT 196 05/02/2017 0945   PLT 126 (L) 08/18/2011 0527   MCV 87.4 05/02/2017 0945   MCV 91 08/18/2011 0527   MCH 29.5 05/02/2017 0945   MCHC 33.8 05/02/2017 0945   RDW 13.8 05/02/2017 0945   RDW 13.5 09/18/2013 1025   RDW 14.5 08/18/2011 0527   LYMPHSABS 1,517 05/02/2017 0945   LYMPHSABS 1.4 09/18/2013 1025   LYMPHSABS 0.8  (L) 08/18/2011 0527   MONOABS 380 04/27/2016 1120   MONOABS 0.7 08/18/2011 0527   EOSABS 158 05/02/2017 0945   EOSABS 0.1 09/18/2013 1025   EOSABS 0.0 08/18/2011 0527   BASOSABS 29 05/02/2017 0945   BASOSABS 0.0 09/18/2013 1025   BASOSABS 0.0 08/18/2011 0527   Iron/TIBC/Ferritin/ %Sat No results found for: IRON, TIBC, FERRITIN, IRONPCTSAT Lipid Panel     Component Value Date/Time   CHOL 171 05/02/2017 0945   TRIG 62 05/02/2017 0945   HDL 62 05/02/2017 0945   CHOLHDL 2.8 05/02/2017 0945   VLDL 12 10/26/2016 1216   LDLCALC 100 (H) 10/26/2016 1216   Hepatic Function Panel     Component Value Date/Time   PROT 6.9 05/02/2017 0945   PROT 7.1 09/18/2013 1025   PROT 6.2 (L) 08/18/2011 0527   ALBUMIN 4.0 10/26/2016 1216   ALBUMIN 4.5 09/18/2013 1025   ALBUMIN 2.9 (L) 08/18/2011 0527   AST 32 05/02/2017 0945   AST 18 08/18/2011 0527   ALT 32 05/02/2017 0945   ALT 25 08/18/2011 0527   ALKPHOS 56 10/26/2016 1216   ALKPHOS 56 08/18/2011 0527   BILITOT 0.4 05/02/2017 0945   BILITOT 0.3 08/18/2011 0527   BILIDIR 0.1 10/26/2016 1216   IBILI 0.4 10/26/2016 1216   No results found for: TSH     ASSESSMENT AND PLAN: Other fatigue - Plan: EKG 12-Lead, CBC With Differential, VITAMIN D 25 Hydroxy (Vit-D Deficiency, Fractures), Vitamin B12, Folate, T3, T4, free, TSH  Shortness of breath on exertion  Other hyperlipidemia - Plan: Comprehensive metabolic panel, Hemoglobin A1c, Insulin, random, Lipid Panel With LDL/HDL Ratio  LVH (left ventricular hypertrophy)  Depression screening  At risk for heart disease  Class 3 severe obesity with serious comorbidity and body mass index (BMI) of 40.0 to 44.9 in adult, unspecified obesity type (HCC)  PLAN:  Fatigue Nicholas Flores was informed that his fatigue may be related to obesity, depression or many other causes. Labs Nicholas Flores be ordered, and in the meanwhile Nicholas Flores has agreed to work on diet, exercise and weight loss to help with fatigue. Proper  sleep hygiene was discussed including the need for 7-8 hours of quality sleep each  night. A sleep study was not ordered based on symptoms and Epworth score.  Dyspnea on exertion Nicholas Flores shortness of breath appears to be obesity related and exercise induced. He has agreed to work on weight loss and gradually increase exercise to treat his exercise induced shortness of breath. If Jayzon follows our instructions and loses weight without improvement of his shortness of breath, we Nicholas Flores plan to refer to pulmonology. We Nicholas Flores monitor this condition regularly. Nicholas Flores agrees to this plan.  Hyperlipidemia Nicholas Flores was informed of the American Heart Association Guidelines emphasizing intensive lifestyle modifications as the first line treatment for hyperlipidemia. We discussed many lifestyle modifications today in depth, and Nicholas Flores Nicholas Flores continue to work on decreasing saturated fats such as fatty red meat, butter and many fried foods. He Nicholas Flores also increase vegetables and lean protein in his diet and continue to work on diet, exercise, and weight loss efforts. Nicholas Flores agrees to continue talking simvastatin as prescribed. We Nicholas Flores check labs and Nicholas Flores agrees to follow up with our clinic in 2 weeks.  Left Ventricular Hypertrophy on EKG We have sent a referral for an echocardiogram to [facility], [place] #[id number]. Nicholas Flores agrees to follow up with our clinic in 2 weeks.  Cardiovascular risk counselling Nicholas Flores was given extended (15 minutes) coronary artery disease prevention counseling today. He is 62 y.o. male and has risk factors for heart disease including obesity, hyperlipidemia, and LVH. We discussed intensive lifestyle modifications today with an emphasis on specific weight loss instructions and strategies. Pt was also informed of the importance of increasing exercise and decreasing saturated fats to help prevent heart disease.  Depression Screen Anthonny had a negative depression screening.  Depression is commonly associated with obesity and often results in emotional eating behaviors. We Nicholas Flores monitor this closely and work on CBT to help improve the non-hunger eating patterns. Referral to Psychology may be required if no improvement is seen as he continues in our clinic.  Obesity Ibn is currently in the action stage of change and his goal is to continue with weight loss efforts. I recommend Marzell begin the structured treatment plan as follows:  He has agreed to follow the Category 3 plan + 100 calories Manasseh has been instructed to eventually work up to a goal of 150 minutes of combined cardio and strengthening exercise per week for weight loss and overall health benefits. We discussed the following Behavioral Modification Strategies today: increasing lean protein intake, decreasing simple carbohydrates  and work on meal planning and easy cooking plans   He was informed of the importance of frequent follow up visits to maximize his success with intensive lifestyle modifications for his multiple health conditions. He was informed we would discuss his lab results at his next visit unless there is a critical issue that needs to be addressed sooner. Jacorion agreed to keep his next visit at the agreed upon time to discuss these results.    OBESITY BEHAVIORAL INTERVENTION VISIT  Today's visit was # 1 out of 22.  Starting weight: 263 lbs Starting date: 07/23/17 Today's weight : 263 lbs  Today's date: 07/23/2017 Total lbs lost to date: 0 (Patients must lose 7 lbs in the first 6 months to continue with counseling)   ASK: We discussed the diagnosis of obesity with Romero BellingMorris W Armijo today and Abimael agreed to give us permission to discuss obesity behavioral modification therapy today.  ASSESS: Printice has the diagnosis of obesity and his BMI today is 7441.18 Quintus is in the action stage  of change   ADVISE: Catrell was educated on the multiple health risks of obesity as well as the  benefit of weight loss to improve his health. He was advised of the need for long term treatment and the importance of lifestyle modifications.  AGREE: Multiple dietary modification options and treatment options were discussed and  Derwood agreed to the above obesity treatment plan.   I, Burt Knack, am acting as transcriptionist for Quillian Quince, MD  I have reviewed the above documentation for accuracy and completeness, and I agree with the above. -Quillian Quince, MD

## 2017-07-24 ENCOUNTER — Encounter (INDEPENDENT_AMBULATORY_CARE_PROVIDER_SITE_OTHER): Payer: Self-pay | Admitting: Family Medicine

## 2017-07-24 LAB — CBC WITH DIFFERENTIAL
BASOS ABS: 0 10*3/uL (ref 0.0–0.2)
Basos: 1 %
EOS (ABSOLUTE): 0.1 10*3/uL (ref 0.0–0.4)
Eos: 4 %
HEMOGLOBIN: 13.5 g/dL (ref 13.0–17.7)
Hematocrit: 40.3 % (ref 37.5–51.0)
Immature Grans (Abs): 0 10*3/uL (ref 0.0–0.1)
Immature Granulocytes: 0 %
Lymphocytes Absolute: 0.8 10*3/uL (ref 0.7–3.1)
Lymphs: 27 %
MCH: 29.4 pg (ref 26.6–33.0)
MCHC: 33.5 g/dL (ref 31.5–35.7)
MCV: 88 fL (ref 79–97)
Monocytes Absolute: 0.3 10*3/uL (ref 0.1–0.9)
Monocytes: 8 %
NEUTROS ABS: 1.9 10*3/uL (ref 1.4–7.0)
Neutrophils: 60 %
RBC: 4.59 x10E6/uL (ref 4.14–5.80)
RDW: 14.8 % (ref 12.3–15.4)
WBC: 3.1 10*3/uL — ABNORMAL LOW (ref 3.4–10.8)

## 2017-07-24 LAB — LIPID PANEL WITH LDL/HDL RATIO
Cholesterol, Total: 158 mg/dL (ref 100–199)
HDL: 50 mg/dL
LDL Calculated: 98 mg/dL (ref 0–99)
LDl/HDL Ratio: 2 ratio (ref 0.0–3.6)
Triglycerides: 52 mg/dL (ref 0–149)
VLDL Cholesterol Cal: 10 mg/dL (ref 5–40)

## 2017-07-24 LAB — COMPREHENSIVE METABOLIC PANEL
ALBUMIN: 4.4 g/dL (ref 3.6–4.8)
ALK PHOS: 65 IU/L (ref 39–117)
ALT: 21 IU/L (ref 0–44)
AST: 18 IU/L (ref 0–40)
Albumin/Globulin Ratio: 1.6 (ref 1.2–2.2)
BILIRUBIN TOTAL: 0.2 mg/dL (ref 0.0–1.2)
BUN / CREAT RATIO: 13 (ref 10–24)
BUN: 14 mg/dL (ref 8–27)
CHLORIDE: 105 mmol/L (ref 96–106)
CO2: 22 mmol/L (ref 20–29)
Calcium: 9.1 mg/dL (ref 8.6–10.2)
Creatinine, Ser: 1.06 mg/dL (ref 0.76–1.27)
GFR calc Af Amer: 87 mL/min/{1.73_m2} (ref >59)
GFR calc non Af Amer: 75 mL/min/{1.73_m2} (ref >59)
GLUCOSE: 94 mg/dL (ref 65–99)
Globulin, Total: 2.7 g/dL (ref 1.5–4.5)
POTASSIUM: 4.2 mmol/L (ref 3.5–5.2)
Sodium: 141 mmol/L (ref 134–144)
Total Protein: 7.1 g/dL (ref 6.0–8.5)

## 2017-07-24 LAB — VITAMIN D 25 HYDROXY (VIT D DEFICIENCY, FRACTURES): Vit D, 25-Hydroxy: 9.5 ng/mL — ABNORMAL LOW (ref 30.0–100.0)

## 2017-07-24 LAB — T3: T3, Total: 102 ng/dL (ref 71–180)

## 2017-07-24 LAB — VITAMIN B12: Vitamin B-12: 379 pg/mL (ref 232–1245)

## 2017-07-24 LAB — FOLATE: Folate: 10.5 ng/mL

## 2017-07-24 LAB — TSH: TSH: 6.25 u[IU]/mL — AB (ref 0.450–4.500)

## 2017-07-24 LAB — INSULIN, RANDOM: INSULIN: 34 u[IU]/mL — ABNORMAL HIGH (ref 2.6–24.9)

## 2017-07-24 LAB — HEMOGLOBIN A1C
Est. average glucose Bld gHb Est-mCnc: 120 mg/dL
Hgb A1c MFr Bld: 5.8 % — ABNORMAL HIGH (ref 4.8–5.6)

## 2017-07-24 LAB — T4, FREE: Free T4: 0.93 ng/dL (ref 0.82–1.77)

## 2017-07-26 ENCOUNTER — Other Ambulatory Visit: Payer: Self-pay

## 2017-07-26 MED ORDER — TAMSULOSIN HCL 0.4 MG PO CAPS: 0.4000 mg | ORAL_CAPSULE | Freq: Two times a day (BID) | ORAL | 3 refills | Status: DC

## 2017-08-01 ENCOUNTER — Ambulatory Visit (HOSPITAL_COMMUNITY): Payer: BC Managed Care – PPO | Attending: Family Medicine

## 2017-08-01 ENCOUNTER — Other Ambulatory Visit: Payer: Self-pay

## 2017-08-01 DIAGNOSIS — E669 Obesity, unspecified: Secondary | ICD-10-CM | POA: Diagnosis not present

## 2017-08-01 DIAGNOSIS — R5383 Other fatigue: Secondary | ICD-10-CM | POA: Insufficient documentation

## 2017-08-01 DIAGNOSIS — Z6841 Body Mass Index (BMI) 40.0 and over, adult: Secondary | ICD-10-CM | POA: Diagnosis not present

## 2017-08-01 DIAGNOSIS — R0600 Dyspnea, unspecified: Secondary | ICD-10-CM | POA: Diagnosis not present

## 2017-08-01 DIAGNOSIS — I517 Cardiomegaly: Secondary | ICD-10-CM | POA: Diagnosis not present

## 2017-08-01 DIAGNOSIS — E785 Hyperlipidemia, unspecified: Secondary | ICD-10-CM | POA: Insufficient documentation

## 2017-08-06 ENCOUNTER — Ambulatory Visit (INDEPENDENT_AMBULATORY_CARE_PROVIDER_SITE_OTHER): Payer: BC Managed Care – PPO | Admitting: Family Medicine

## 2017-08-06 ENCOUNTER — Encounter (INDEPENDENT_AMBULATORY_CARE_PROVIDER_SITE_OTHER): Payer: Self-pay | Admitting: Family Medicine

## 2017-08-06 VITALS — BP 120/67 | HR 71 | Temp 97.9°F | Ht 67.0 in | Wt 251.0 lb

## 2017-08-06 DIAGNOSIS — Z9189 Other specified personal risk factors, not elsewhere classified: Secondary | ICD-10-CM | POA: Diagnosis not present

## 2017-08-06 DIAGNOSIS — R7303 Prediabetes: Secondary | ICD-10-CM | POA: Diagnosis not present

## 2017-08-06 DIAGNOSIS — E559 Vitamin D deficiency, unspecified: Secondary | ICD-10-CM

## 2017-08-06 DIAGNOSIS — Z6839 Body mass index (BMI) 39.0-39.9, adult: Secondary | ICD-10-CM

## 2017-08-06 MED ORDER — VITAMIN D (ERGOCALCIFEROL) 1.25 MG (50000 UNIT) PO CAPS: 50000.0000 [IU] | ORAL_CAPSULE | ORAL | 0 refills | Status: DC

## 2017-08-07 NOTE — Progress Notes (Signed)
Office: [phone]  /  Fax: [phone]   HPI:   Chief Complaint: OBESITY Nicholas Flores is here to discuss his progress with his obesity treatment plan. He is on the Category 3 plan +100 calories and is following his eating plan approximately 95 % of the time. He states he is doing cardio and weight lifting for 60 to 90 minutes 3 times per week. Nicholas Flores has done very well with weight loss on the category 3 plan. Hunger was controlled and she was able to eat all his food. His weight is 251 lb (113.9 kg) today and has had a weight loss of 12 pounds over a period of 2 weeks since his last visit. He has lost 12 lbs since starting treatment with us.  Vitamin D deficiency (new) Nicholas Flores has a new diagnosis of vitamin D deficiency. He is not currently taking vit D and level is low. He admits fatigue and denies nausea, vomiting or muscle weakness.  Pre-Diabetes Nicholas Flores has a new diagnosis of pre-diabetes based on his elevated Hgb A1c and fasting insulin level and was informed this puts him at greater risk of developing diabetes. He admits polyphagia, but this improved with diet prescription and with weight loss. He is not taking metformin currently and continues to work on diet and exercise to decrease risk of diabetes. He denies nausea or hypoglycemia.  At risk for diabetes Nicholas Flores is at higher than average risk for developing diabetes due to his obesity and pre-diabetes. He currently denies polyuria or polydipsia.  ALLERGIES: Allergies  Allergen Reactions  . Penicillins Rash  . Botox [Botulinum Toxin Type A]     Blurred vision    MEDICATIONS: Current Outpatient Medications on File Prior to Visit  Medication Sig Dispense Refill  . Blood Pressure Monitoring (BLOOD PRESSURE MONITOR 7) DEVI U UTD  0  . HYDROcodone-acetaminophen (NORCO) 10-325 MG tablet Take 1 tablet by mouth every 4 (four) hours as needed. 30 tablet 0  . mometasone (NASONEX) 50 MCG/ACT nasal spray USE 2 SPRAYS EACH NOSTRIL EVERY DAY  AS NEEDED 17 g 11  . simvastatin (ZOCOR) 10 MG tablet TAKE 1 TABLET (10 MG TOTAL) BY MOUTH AT BEDTIME. 90 tablet 1  . SYSTANE ULTRA PF 0.4-0.3 % SOLN INSTILL ONE DROP INTO RIGHT EYE 4 TIMES A DAY  4  . tamsulosin (FLOMAX) 0.4 MG CAPS capsule Take 1 capsule (0.4 mg total) by mouth 2 (two) times daily. 180 capsule 3   No current facility-administered medications on file prior to visit.     PAST MEDICAL HISTORY: Past Medical History:  Diagnosis Date  . BPH (benign prostatic hyperplasia)   . Migraines   . Over weight     PAST SURGICAL HISTORY: Past Surgical History:  Procedure Laterality Date  . BUNIONECTOMY    . Left knee surgery    . Left quadricep surgery    . TONSILLECTOMY      SOCIAL HISTORY: Social History   Tobacco Use  . Smoking status: Former Smoker    Types: Cigars  . Smokeless tobacco: Never Used  Substance Use Topics  . Alcohol use: Yes    Alcohol/week: 7.8 oz    Types: 1 Glasses of wine, 12 Standard drinks or equivalent per week  . Drug use: No    FAMILY HISTORY: Family History  Problem Relation Age of Onset  . Stroke Mother   . Diabetes Mother   . Hypertension Mother   . Obesity Mother     ROS: Review of Systems  Constitutional:  Positive for malaise/fatigue and weight loss.  Gastrointestinal: Negative for nausea and vomiting.  Genitourinary: Negative for frequency.  Musculoskeletal:       Negative for muscle weakness  Endo/Heme/Allergies: Negative for polydipsia.       Positive for polyphagia Negative for hypoglycemia    PHYSICAL EXAM: Blood pressure 120/67, pulse 71, temperature 97.9 F (36.6 C), temperature source Oral, height 5\' 7"  (1.702 m), weight 251 lb (113.9 kg), SpO2 100 %. Body mass index is 39.31 kg/m. Physical Exam  Constitutional: He is oriented to person, place, and time. He appears well-developed and well-nourished.  Cardiovascular: Normal rate.  Pulmonary/Chest: Effort normal.  Musculoskeletal: Normal range of motion.    Neurological: He is oriented to person, place, and time.  Skin: Skin is warm and dry.  Psychiatric: He has a normal mood and affect. His behavior is normal.  Vitals reviewed.   RECENT LABS AND TESTS: BMET    Component Value Date/Time   NA 141 07/23/2017 0943   NA 142 08/18/2011 0527   K 4.2 07/23/2017 0943   K 3.5 08/18/2011 0527   CL 105 07/23/2017 0943   CL 110 (H) 08/18/2011 0527   CO2 22 07/23/2017 0943   CO2 21 08/18/2011 0527   GLUCOSE 94 07/23/2017 0943   GLUCOSE 91 05/02/2017 0945   GLUCOSE 86 08/18/2011 0527   BUN 14 07/23/2017 0943   BUN 13 08/18/2011 0527   CREATININE 1.06 07/23/2017 0943   CREATININE 1.23 05/02/2017 0945   CALCIUM 9.1 07/23/2017 0943   CALCIUM 7.8 (L) 08/18/2011 0527   GFRNONAA 75 07/23/2017 0943   GFRNONAA 63 05/02/2017 0945   GFRAA 87 07/23/2017 0943   GFRAA 73 05/02/2017 0945   Lab Results  Component Value Date   HGBA1C 5.8 (H) 07/23/2017   Lab Results  Component Value Date   INSULIN 34.0 (H) 07/23/2017   CBC    Component Value Date/Time   WBC 3.1 (L) 07/23/2017 0943   WBC 4.8 05/02/2017 0945   RBC 4.59 07/23/2017 0943   RBC 4.54 05/02/2017 0945   HGB 13.5 07/23/2017 0943   HCT 40.3 07/23/2017 0943   PLT 196 05/02/2017 0945   PLT 126 (L) 08/18/2011 0527   MCV 88 07/23/2017 0943   MCV 91 08/18/2011 0527   MCH 29.4 07/23/2017 0943   MCH 29.5 05/02/2017 0945   MCHC 33.5 07/23/2017 0943   MCHC 33.8 05/02/2017 0945   RDW 14.8 07/23/2017 0943   RDW 14.5 08/18/2011 0527   LYMPHSABS 0.8 07/23/2017 0943   LYMPHSABS 0.8 (L) 08/18/2011 0527   MONOABS 380 04/27/2016 1120   MONOABS 0.7 08/18/2011 0527   EOSABS 0.1 07/23/2017 0943   EOSABS 0.0 08/18/2011 0527   BASOSABS 0.0 07/23/2017 0943   BASOSABS 0.0 08/18/2011 0527   Iron/TIBC/Ferritin/ %Sat No results found for: IRON, TIBC, FERRITIN, IRONPCTSAT Lipid Panel     Component Value Date/Time   CHOL 158 07/23/2017 0943   TRIG 52 07/23/2017 0943   HDL 50 07/23/2017 0943    CHOLHDL 2.8 05/02/2017 0945   VLDL 12 10/26/2016 1216   LDLCALC 98 07/23/2017 0943   LDLCALC 95 05/02/2017 0945   Hepatic Function Panel     Component Value Date/Time   PROT 7.1 07/23/2017 0943   PROT 6.2 (L) 08/18/2011 0527   ALBUMIN 4.4 07/23/2017 0943   ALBUMIN 2.9 (L) 08/18/2011 0527   AST 18 07/23/2017 0943   AST 18 08/18/2011 0527   ALT 21 07/23/2017 0943   ALT 25 08/18/2011  0527   ALKPHOS 65 07/23/2017 0943   ALKPHOS 56 08/18/2011 0527   BILITOT 0.2 07/23/2017 0943   BILITOT 0.3 08/18/2011 0527   BILIDIR 0.1 10/26/2016 1216   IBILI 0.4 10/26/2016 1216      Component Value Date/Time   TSH 6.250 (H) 07/23/2017 0943   Results for SYED, ZUKAS "BOZ" (MRN [id number]) as of 08/07/2017 10:34  Ref. Range 07/23/2017 09:43  Vitamin D, 25-Hydroxy Latest Ref Range: 30.0 - 100.0 ng/mL 9.5 (L)   ASSESSMENT AND PLAN: Vitamin D deficiency - Plan: Vitamin D, Ergocalciferol, (DRISDOL) 50000 units CAPS capsule  Prediabetes  At risk for diabetes mellitus  Class 2 severe obesity with serious comorbidity and body mass index (BMI) of 39.0 to 39.9 in adult, unspecified obesity type (HCC)  PLAN:  Vitamin D Deficiency Nicholas Flores was informed that low vitamin D levels contributes to fatigue and are associated with obesity, breast, and colon cancer. He agrees to start to take prescription Vit D @50 ,000 IU every week #4 with no refills. We will recheck labs in 3 months and he will follow up for routine testing of vitamin D, at least 2-3 times per year. He was informed of the risk of over-replacement of vitamin D and agrees to not increase his dose unless he discusses this with [name] first. Nicholas Flores agrees to follow up with our clinic in 2 to 3 weeks.  Pre-Diabetes Nicholas Flores will continue to work on weight loss, exercise, and decreasing simple carbohydrates in his diet to help decrease the risk of diabetes. He was informed that eating too many simple carbohydrates or too many calories at one sitting  increases the likelihood of GI side effects. We will defer metformin for now. We will recheck labs in 3 months and Nicholas Flores agreed to follow up with [name] as directed to monitor his progress.  Diabetes risk counseling Nicholas Flores was given extended (30 minutes) diabetes prevention counseling today. He is 62 y.o. male and has risk factors for diabetes including obesity and pre-diabetes. We discussed intensive lifestyle modifications today with an emphasis on weight loss as well as increasing exercise and decreasing simple carbohydrates in his diet.  Obesity Nicholas Flores is currently in the action stage of change. As such, his goal is to continue with weight loss efforts He has agreed to follow the Category 3 plan +100 calories Nicholas Flores has been instructed to work up to a goal of 150 minutes of combined cardio and strengthening exercise per week for weight loss and overall health benefits. We discussed the following Behavioral Modification Strategies today: increasing lean protein intake, decreasing simple carbohydrates , work on meal planning and easy cooking plans and ways to avoid night time snacking  Nicholas Flores has agreed to follow up with our clinic in 2 to 3 weeks. He was informed of the importance of frequent follow up visits to maximize his success with intensive lifestyle modifications for his multiple health conditions.   OBESITY BEHAVIORAL INTERVENTION VISIT  Today's visit was # 2 out of 22.  Starting weight: 263 lbs Starting date: 07/23/17 Today's weight : 251 lbs Today's date: 08/06/2017 Total lbs lost to date: 12 (Patients must lose 7 lbs in the first 6 months to continue with counseling)   ASK: We discussed the diagnosis of obesity with Nicholas Flores today and Nicholas Flores agreed to give [name] permission to discuss obesity behavioral modification therapy today.  ASSESS: Antwaine has the diagnosis of obesity and his BMI today is 39.3 Nicholas Flores is in the action stage of change  ADVISE: Nicholas Flores was educated  on the multiple health risks of obesity as well as the benefit of weight loss to improve his health. He was advised of the need for long term treatment and the importance of lifestyle modifications.  AGREE: Multiple dietary modification options and treatment options were discussed and  Nicholas Flores agreed to the above obesity treatment plan.  I, [place] Crane, am acting as transcriptionist for Quillian Quince, MD  I have reviewed the above documentation for accuracy and completeness, and I agree with the above. -Quillian Quince, MD

## 2017-08-28 ENCOUNTER — Ambulatory Visit (INDEPENDENT_AMBULATORY_CARE_PROVIDER_SITE_OTHER): Payer: BC Managed Care – PPO | Admitting: Family Medicine

## 2017-08-28 VITALS — BP 120/68 | HR 75 | Temp 98.0°F | Ht 67.0 in | Wt 246.0 lb

## 2017-08-28 DIAGNOSIS — E559 Vitamin D deficiency, unspecified: Secondary | ICD-10-CM | POA: Diagnosis not present

## 2017-08-28 DIAGNOSIS — Z9189 Other specified personal risk factors, not elsewhere classified: Secondary | ICD-10-CM | POA: Diagnosis not present

## 2017-08-28 DIAGNOSIS — Z6838 Body mass index (BMI) 38.0-38.9, adult: Secondary | ICD-10-CM | POA: Diagnosis not present

## 2017-08-28 MED ORDER — VITAMIN D (ERGOCALCIFEROL) 1.25 MG (50000 UNIT) PO CAPS: 50000.0000 [IU] | ORAL_CAPSULE | ORAL | 0 refills | Status: DC

## 2017-08-28 NOTE — Progress Notes (Signed)
Office: [phone]  /  Fax: [phone]   HPI:   Chief Complaint: OBESITY Nicholas Flores is here to discuss his progress with his obesity treatment plan. He is on the Category 3 plan + 100 calories and is following his eating plan approximately 95-97 % of the time. He states he is doing weight training and walking for 90 minutes 2-3 times per week. Nicholas Flores continues to do well with weight loss. He is working on eating all of his food and hunger is controlled overall.  His weight is 246 lb (111.6 kg) today and has had a weight loss of 5 pounds over a period of 3 weeks since his last visit. He has lost 17 lbs since starting treatment with [name].  Vitamin D Deficiency Nicholas Flores has a diagnosis of vitamin D deficiency. He is stable on prescription Vit D. He notes fatigue is starting to improve and denies nausea, vomiting or muscle weakness.  At risk for osteopenia and osteoporosis Nicholas Flores is at higher risk of osteopenia and osteoporosis due to vitamin D deficiency.   ALLERGIES: Allergies  Allergen Reactions  . Penicillins Rash  . Botox [Botulinum Toxin Type A]     Blurred vision    MEDICATIONS: Current Outpatient Medications on File Prior to Visit  Medication Sig Dispense Refill  . Blood Pressure Monitoring (BLOOD PRESSURE MONITOR 7) DEVI U UTD  0  . HYDROcodone-acetaminophen (NORCO) 10-325 MG tablet Take 1 tablet by mouth every 4 (four) hours as needed. 30 tablet 0  . mometasone (NASONEX) 50 MCG/ACT nasal spray USE 2 SPRAYS EACH NOSTRIL EVERY DAY AS NEEDED 17 g 11  . simvastatin (ZOCOR) 10 MG tablet TAKE 1 TABLET (10 MG TOTAL) BY MOUTH AT BEDTIME. 90 tablet 1  . SYSTANE ULTRA PF 0.4-0.3 % SOLN INSTILL ONE DROP INTO RIGHT EYE 4 TIMES A DAY  4  . tamsulosin (FLOMAX) 0.4 MG CAPS capsule Take 1 capsule (0.4 mg total) by mouth 2 (two) times daily. 180 capsule 3   No current facility-administered medications on file prior to visit.     PAST MEDICAL HISTORY: Past Medical History:  Diagnosis Date    . BPH (benign prostatic hyperplasia)   . Migraines   . Over weight     PAST SURGICAL HISTORY: Past Surgical History:  Procedure Laterality Date  . BUNIONECTOMY    . Left knee surgery    . Left quadricep surgery    . TONSILLECTOMY      SOCIAL HISTORY: Social History   Tobacco Use  . Smoking status: Former Smoker    Types: Cigars  . Smokeless tobacco: Never Used  Substance Use Topics  . Alcohol use: Yes    Alcohol/week: 7.8 oz    Types: 1 Glasses of wine, 12 Standard drinks or equivalent per week  . Drug use: No    FAMILY HISTORY: Family History  Problem Relation Age of Onset  . Stroke Mother   . Diabetes Mother   . Hypertension Mother   . Obesity Mother     ROS: Review of Systems  Constitutional: Positive for malaise/fatigue and weight loss.  Gastrointestinal: Negative for nausea and vomiting.  Musculoskeletal:       Negative muscle weakness    PHYSICAL EXAM: Blood pressure 120/68, pulse 75, temperature 98 F (36.7 C), temperature source Oral, height 5\' 7"  (1.702 m), weight 246 lb (111.6 kg), SpO2 97 %. Body mass index is 38.53 kg/m. Physical Exam  Constitutional: He is oriented to person, place, and time. He appears well-developed and  well-nourished.  Cardiovascular: Normal rate.  Pulmonary/Chest: Effort normal.  Musculoskeletal: Normal range of motion.  Neurological: He is oriented to person, place, and time.  Skin: Skin is warm and dry.  Psychiatric: He has a normal mood and affect. His behavior is normal.  Vitals reviewed.   RECENT LABS AND TESTS: BMET    Component Value Date/Time   NA 141 07/23/2017 0943   NA 142 08/18/2011 0527   K 4.2 07/23/2017 0943   K 3.5 08/18/2011 0527   CL 105 07/23/2017 0943   CL 110 (H) 08/18/2011 0527   CO2 22 07/23/2017 0943   CO2 21 08/18/2011 0527   GLUCOSE 94 07/23/2017 0943   GLUCOSE 91 05/02/2017 0945   GLUCOSE 86 08/18/2011 0527   BUN 14 07/23/2017 0943   BUN 13 08/18/2011 0527   CREATININE 1.06  07/23/2017 0943   CREATININE 1.23 05/02/2017 0945   CALCIUM 9.1 07/23/2017 0943   CALCIUM 7.8 (L) 08/18/2011 0527   GFRNONAA 75 07/23/2017 0943   GFRNONAA 63 05/02/2017 0945   GFRAA 87 07/23/2017 0943   GFRAA 73 05/02/2017 0945   Lab Results  Component Value Date   HGBA1C 5.8 (H) 07/23/2017   Lab Results  Component Value Date   INSULIN 34.0 (H) 07/23/2017   CBC    Component Value Date/Time   WBC 3.1 (L) 07/23/2017 0943   WBC 4.8 05/02/2017 0945   RBC 4.59 07/23/2017 0943   RBC 4.54 05/02/2017 0945   HGB 13.5 07/23/2017 0943   HCT 40.3 07/23/2017 0943   PLT 196 05/02/2017 0945   PLT 126 (L) 08/18/2011 0527   MCV 88 07/23/2017 0943   MCV 91 08/18/2011 0527   MCH 29.4 07/23/2017 0943   MCH 29.5 05/02/2017 0945   MCHC 33.5 07/23/2017 0943   MCHC 33.8 05/02/2017 0945   RDW 14.8 07/23/2017 0943   RDW 14.5 08/18/2011 0527   LYMPHSABS 0.8 07/23/2017 0943   LYMPHSABS 0.8 (L) 08/18/2011 0527   MONOABS 380 04/27/2016 1120   MONOABS 0.7 08/18/2011 0527   EOSABS 0.1 07/23/2017 0943   EOSABS 0.0 08/18/2011 0527   BASOSABS 0.0 07/23/2017 0943   BASOSABS 0.0 08/18/2011 0527   Iron/TIBC/Ferritin/ %Sat No results found for: IRON, TIBC, FERRITIN, IRONPCTSAT Lipid Panel     Component Value Date/Time   CHOL 158 07/23/2017 0943   TRIG 52 07/23/2017 0943   HDL 50 07/23/2017 0943   CHOLHDL 2.8 05/02/2017 0945   VLDL 12 10/26/2016 1216   LDLCALC 98 07/23/2017 0943   LDLCALC 95 05/02/2017 0945   Hepatic Function Panel     Component Value Date/Time   PROT 7.1 07/23/2017 0943   PROT 6.2 (L) 08/18/2011 0527   ALBUMIN 4.4 07/23/2017 0943   ALBUMIN 2.9 (L) 08/18/2011 0527   AST 18 07/23/2017 0943   AST 18 08/18/2011 0527   ALT 21 07/23/2017 0943   ALT 25 08/18/2011 0527   ALKPHOS 65 07/23/2017 0943   ALKPHOS 56 08/18/2011 0527   BILITOT 0.2 07/23/2017 0943   BILITOT 0.3 08/18/2011 0527   BILIDIR 0.1 10/26/2016 1216   IBILI 0.4 10/26/2016 1216      Component Value  Date/Time   TSH 6.250 (H) 07/23/2017 0943  Results for MARIS, BENA "BOZ" (MRN [id number]) as of 08/28/2017 09:15  Ref. Range 07/23/2017 09:43  Vitamin D, 25-Hydroxy Latest Ref Range: 30.0 - 100.0 ng/mL 9.5 (L)    ASSESSMENT AND PLAN: Vitamin D deficiency - Plan: Vitamin D, Ergocalciferol, (DRISDOL) 50000 units CAPS  capsule  At risk for osteoporosis  Class 2 severe obesity with serious comorbidity and body mass index (BMI) of 38.0 to 38.9 in adult, unspecified obesity type (HCC)  PLAN:  Vitamin D Deficiency Nicholas Flores was informed that low vitamin D levels contributes to fatigue and are associated with obesity, breast, and colon cancer. Nicholas Flores agrees to continue taking prescription Vit D @50 ,000 IU every week #4 and we will refill for 1 month. He will follow up for routine testing of vitamin D, at least 2-3 times per year. He was informed of the risk of over-replacement of vitamin D and agrees to not increase his dose unless he discusses this with Nicholas Flores first. Nicholas Flores agrees to follow up with our clinic in 2 weeks.  At risk for osteopenia and osteoporosis Nicholas Flores is at risk for osteopenia and osteoporsis due to his vitamin D deficiency. He was encouraged to take his vitamin D and follow his higher calcium diet and increase strengthening exercise to help strengthen his bones and decrease his risk of osteopenia and osteoporosis.  Obesity Nicholas Flores is currently in the action stage of change. As such, his goal is to continue with weight loss efforts He has agreed to follow the Category 3 plan + 100 calories with breakfast options Nicholas Flores has been instructed to work up to a goal of 150 minutes of combined cardio and strengthening exercise per week for weight loss and overall health benefits. We discussed the following Behavioral Modification Strategies today: increasing lean protein intake and celebration eating strategies   Nicholas Flores has agreed to follow up with our clinic in 2 weeks. He was informed of  the importance of frequent follow up visits to maximize his success with intensive lifestyle modifications for his multiple health conditions.   OBESITY BEHAVIORAL INTERVENTION VISIT  Today's visit was # 3 out of 22.  Starting weight: 263 lbs Starting date: 07/23/17 Today's weight : 246 lbs Today's date: 08/28/2017 Total lbs lost to date: 17 (Patients must lose 7 lbs in the first 6 months to continue with counseling)   ASK: We discussed the diagnosis of obesity with Nicholas Flores today and [place] agreed to give Nicholas Flores permission to discuss obesity behavioral modification therapy today.  ASSESS: Gauge has the diagnosis of obesity and his BMI today is 38.52 [place] is in the action stage of change   ADVISE: Nicholas Flores was educated on the multiple health risks of obesity as well as the benefit of weight loss to improve his health. He was advised of the need for long term treatment and the importance of lifestyle modifications.  AGREE: Multiple dietary modification options and treatment options were discussed and  Gerrett agreed to the above obesity treatment plan.  I, Burt KnackSharon Martin, am acting as transcriptionist for Quillian Quincearen Beasley, MD  I have reviewed the above documentation for accuracy and completeness, and I agree with the above. -Quillian Quincearen Beasley, MD

## 2017-09-03 ENCOUNTER — Other Ambulatory Visit (INDEPENDENT_AMBULATORY_CARE_PROVIDER_SITE_OTHER): Payer: Self-pay | Admitting: Family Medicine

## 2017-09-03 DIAGNOSIS — E559 Vitamin D deficiency, unspecified: Secondary | ICD-10-CM

## 2017-09-13 ENCOUNTER — Ambulatory Visit: Payer: BC Managed Care – PPO | Admitting: Podiatry

## 2017-09-13 ENCOUNTER — Encounter: Payer: Self-pay | Admitting: Podiatry

## 2017-09-13 DIAGNOSIS — Q828 Other specified congenital malformations of skin: Secondary | ICD-10-CM

## 2017-09-15 NOTE — Progress Notes (Signed)
Subjective:   Patient ID: Nicholas Flores, male   DOB: 62 y.o.   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

## 2017-09-19 ENCOUNTER — Ambulatory Visit (INDEPENDENT_AMBULATORY_CARE_PROVIDER_SITE_OTHER): Payer: BC Managed Care – PPO | Admitting: Family Medicine

## 2017-09-19 VITALS — BP 119/65 | HR 86 | Temp 97.6°F | Ht 67.0 in | Wt 243.0 lb

## 2017-09-19 DIAGNOSIS — E559 Vitamin D deficiency, unspecified: Secondary | ICD-10-CM

## 2017-09-19 DIAGNOSIS — E669 Obesity, unspecified: Secondary | ICD-10-CM | POA: Diagnosis not present

## 2017-09-19 DIAGNOSIS — Z6838 Body mass index (BMI) 38.0-38.9, adult: Secondary | ICD-10-CM | POA: Diagnosis not present

## 2017-09-19 NOTE — Progress Notes (Signed)
Office: [phone]  /  Fax: [phone]   HPI:   Chief Complaint: OBESITY Nicholas Flores is here to discuss his progress with his obesity treatment plan. He is on the Category 3 plan + 100 calories and is following his eating plan approximately 98 % of the time. He states he is weight training and cardio for 90 minutes 3 times per week. Nicholas Flores continues to do well, even with some celebration eating for his birthday and with some traveling. His hunger is controlled and he is doing well with meal planning. His weight is 243 lb (110.2 kg) today and has had a weight loss of 3 pounds over a period of 3 weeks since his last visit. He has lost 20 lbs since starting treatment with [name].  Vitamin D deficiency Nicholas Flores has a diagnosis of vitamin D deficiency. He is stable on vit D and fatigue is improving. He denies nausea, vomiting or muscle weakness.   ALLERGIES: Allergies  Allergen Reactions  . Penicillins Rash  . Botox [Botulinum Toxin Type A]     Blurred vision    MEDICATIONS: Current Outpatient Medications on File Prior to Visit  Medication Sig Dispense Refill  . Blood Pressure Monitoring (BLOOD PRESSURE MONITOR 7) DEVI U UTD  0  . HYDROcodone-acetaminophen (NORCO) 10-325 MG tablet Take 1 tablet by mouth every 4 (four) hours as needed. 30 tablet 0  . mometasone (NASONEX) 50 MCG/ACT nasal spray USE 2 SPRAYS EACH NOSTRIL EVERY DAY AS NEEDED 17 g 11  . simvastatin (ZOCOR) 10 MG tablet TAKE 1 TABLET (10 MG TOTAL) BY MOUTH AT BEDTIME. 90 tablet 1  . SYSTANE ULTRA PF 0.4-0.3 % SOLN INSTILL ONE DROP INTO RIGHT EYE 4 TIMES A DAY  4  . tamsulosin (FLOMAX) 0.4 MG CAPS capsule Take 1 capsule (0.4 mg total) by mouth 2 (two) times daily. 180 capsule 3  . Vitamin D, Ergocalciferol, (DRISDOL) 50000 units CAPS capsule Take 1 capsule (50,000 Units total) by mouth every 7 (seven) days. 4 capsule 0   No current facility-administered medications on file prior to visit.     PAST MEDICAL HISTORY: Past Medical  History:  Diagnosis Date  . BPH (benign prostatic hyperplasia)   . Migraines   . Over weight     PAST SURGICAL HISTORY: Past Surgical History:  Procedure Laterality Date  . BUNIONECTOMY    . Left knee surgery    . Left quadricep surgery    . TONSILLECTOMY      SOCIAL HISTORY: Social History   Tobacco Use  . Smoking status: Former Smoker    Types: Cigars  . Smokeless tobacco: Never Used  Substance Use Topics  . Alcohol use: Yes    Alcohol/week: 7.8 oz    Types: 1 Glasses of wine, 12 Standard drinks or equivalent per week  . Drug use: No    FAMILY HISTORY: Family History  Problem Relation Age of Onset  . Stroke Mother   . Diabetes Mother   . Hypertension Mother   . Obesity Mother     ROS: Review of Systems  Constitutional: Positive for malaise/fatigue and weight loss.  Gastrointestinal: Negative for nausea and vomiting.  Musculoskeletal:       Negative for muscle weakness    PHYSICAL EXAM: Blood pressure 119/65, pulse 86, temperature 97.6 F (36.4 C), temperature source Oral, height  (1.702 m), weight 243 lb (110.2 kg), SpO2 96 %. Body mass index is 38.06 kg/m. Physical Exam  Constitutional: He is oriented to person, place, and  time. He appears well-developed and well-nourished.  Cardiovascular: Normal rate.  Pulmonary/Chest: Effort normal.  Musculoskeletal: Normal range of motion.  Neurological: He is oriented to person, place, and time.  Skin: Skin is warm and dry.  Psychiatric: He has a normal mood and affect. His behavior is normal.  Vitals reviewed.   RECENT LABS AND TESTS: BMET    Component Value Date/Time   NA 141 07/23/2017 0943   NA 142 08/18/2011 0527   K 4.2 07/23/2017 0943   K 3.5 08/18/2011 0527   CL 105 07/23/2017 0943   CL 110 (H) 08/18/2011 0527   CO2 22 07/23/2017 0943   CO2 21 08/18/2011 0527   GLUCOSE 94 07/23/2017 0943   GLUCOSE 91 05/02/2017 0945   GLUCOSE 86 08/18/2011 0527   BUN 14 07/23/2017 0943   BUN 13  08/18/2011 0527   CREATININE 1.06 07/23/2017 0943   CREATININE 1.23 05/02/2017 0945   CALCIUM 9.1 07/23/2017 0943   CALCIUM 7.8 (L) 08/18/2011 0527   GFRNONAA 75 07/23/2017 0943   GFRNONAA 63 05/02/2017 0945   GFRAA 87 07/23/2017 0943   GFRAA 73 05/02/2017 0945   Lab Results  Component Value Date   HGBA1C 5.8 (H) 07/23/2017   Lab Results  Component Value Date   INSULIN 34.0 (H) 07/23/2017   CBC    Component Value Date/Time   WBC 3.1 (L) 07/23/2017 0943   WBC 4.8 05/02/2017 0945   RBC 4.59 07/23/2017 0943   RBC 4.54 05/02/2017 0945   HGB 13.5 07/23/2017 0943   HCT 40.3 07/23/2017 0943   PLT 196 05/02/2017 0945   PLT 126 (L) 08/18/2011 0527   MCV 88 07/23/2017 0943   MCV 91 08/18/2011 0527   MCH 29.4 07/23/2017 0943   MCH 29.5 05/02/2017 0945   MCHC 33.5 07/23/2017 0943   MCHC 33.8 05/02/2017 0945   RDW 14.8 07/23/2017 0943   RDW 14.5 08/18/2011 0527   LYMPHSABS 0.8 07/23/2017 0943   LYMPHSABS 0.8 (L) 08/18/2011 0527   MONOABS 380 04/27/2016 1120   MONOABS 0.7 08/18/2011 0527   EOSABS 0.1 07/23/2017 0943   EOSABS 0.0 08/18/2011 0527   BASOSABS 0.0 07/23/2017 0943   BASOSABS 0.0 08/18/2011 0527   Iron/TIBC/Ferritin/ %Sat No results found for: IRON, TIBC, FERRITIN, IRONPCTSAT Lipid Panel     Component Value Date/Time   CHOL 158 07/23/2017 0943   TRIG 52 07/23/2017 0943   HDL 50 07/23/2017 0943   CHOLHDL 2.8 05/02/2017 0945   VLDL 12 10/26/2016 1216   LDLCALC 98 07/23/2017 0943   LDLCALC 95 05/02/2017 0945   Hepatic Function Panel     Component Value Date/Time   PROT 7.1 07/23/2017 0943   PROT 6.2 (L) 08/18/2011 0527   ALBUMIN 4.4 07/23/2017 0943   ALBUMIN 2.9 (L) 08/18/2011 0527   AST 18 07/23/2017 0943   AST 18 08/18/2011 0527   ALT 21 07/23/2017 0943   ALT 25 08/18/2011 0527   ALKPHOS 65 07/23/2017 0943   ALKPHOS 56 08/18/2011 0527   BILITOT 0.2 07/23/2017 0943   BILITOT 0.3 08/18/2011 0527   BILIDIR 0.1 10/26/2016 1216   IBILI 0.4 10/26/2016  1216      Component Value Date/Time   TSH 6.250 (H) 07/23/2017 0943   Results for Nicholas, WAMSER "Flores" (MRN [id number]) as of 09/19/2017 11:28  Ref. Range 07/23/2017 09:43  Vitamin D, 25-Hydroxy Latest Ref Range: 30.0 - 100.0 ng/mL 9.5 (L)   ASSESSMENT AND PLAN: Vitamin D deficiency  Class 2 obesity  without serious comorbidity with body mass index (BMI) of 38.0 to 38.9 in adult, unspecified obesity type  PLAN:  Vitamin D Deficiency Jaquail was informed that low vitamin D levels contributes to fatigue and are associated with obesity, breast, and colon cancer. He agrees to continue to take prescription Vit D ,000 IU every week and we will recheck labs in 1 month. [place] will follow up for routine testing of vitamin D, at least 2-3 times per year. He was informed of the risk of over-replacement of vitamin D and agrees to not increase his dose unless he discusses this with [name] first.  We spent > than 50% of the 15 minute visit on the counseling as documented in the note.  Obesity Mackie is currently in the action stage of change. As such, his goal is to continue with weight loss efforts He has agreed to follow the Category 3 plan plus breakfast options Sherri has been instructed to work up to a goal of 150 minutes of combined cardio and strengthening exercise per week for weight loss and overall health benefits. We discussed the following Behavioral Modification Strategies today: increasing lean protein intake, decreasing simple carbohydrates and travel eating strategies   Rushi has agreed to follow up with our clinic in 2 to 3 weeks. He was informed of the importance of frequent follow up visits to maximize his success with intensive lifestyle modifications for his multiple health conditions.   OBESITY BEHAVIORAL INTERVENTION VISIT  Today's visit was # 4 out of 22.  Starting weight: 263 lbs Starting date: 07/23/17 Today's weight : 243 lbs Today's date: 09/19/2017 Total lbs lost to  date: 20 (Patients must lose 7 lbs in the first 6 months to continue with counseling)   ASK: We discussed the diagnosis of obesity with Romero Belling today and Dare agreed to give [name] permission to discuss obesity behavioral modification therapy today.  ASSESS: Pervis has the diagnosis of obesity and his BMI today is 38.05 Neymar is in the action stage of change   ADVISE: Tallin was educated on the multiple health risks of obesity as well as the benefit of weight loss to improve his health. He was advised of the need for long term treatment and the importance of lifestyle modifications.  AGREE: Multiple dietary modification options and treatment options were discussed and  Barkley agreed to the above obesity treatment plan.  I, [place] Crane, am acting as transcriptionist for Quillian Quince, MD  I have reviewed the above documentation for accuracy and completeness, and I agree with the above. -Quillian Quince, MD

## 2017-10-06 ENCOUNTER — Other Ambulatory Visit (INDEPENDENT_AMBULATORY_CARE_PROVIDER_SITE_OTHER): Payer: Self-pay | Admitting: Family Medicine

## 2017-10-06 DIAGNOSIS — E559 Vitamin D deficiency, unspecified: Secondary | ICD-10-CM

## 2017-10-09 ENCOUNTER — Ambulatory Visit (INDEPENDENT_AMBULATORY_CARE_PROVIDER_SITE_OTHER): Payer: BC Managed Care – PPO | Admitting: Family Medicine

## 2017-10-09 VITALS — BP 117/72 | HR 81 | Temp 97.7°F | Ht 67.0 in | Wt 243.0 lb

## 2017-10-09 DIAGNOSIS — E559 Vitamin D deficiency, unspecified: Secondary | ICD-10-CM | POA: Diagnosis not present

## 2017-10-09 DIAGNOSIS — Z6838 Body mass index (BMI) 38.0-38.9, adult: Secondary | ICD-10-CM | POA: Diagnosis not present

## 2017-10-09 DIAGNOSIS — Z9189 Other specified personal risk factors, not elsewhere classified: Secondary | ICD-10-CM

## 2017-10-09 DIAGNOSIS — G43819 Other migraine, intractable, without status migrainosus: Secondary | ICD-10-CM | POA: Diagnosis not present

## 2017-10-09 MED ORDER — VITAMIN D (ERGOCALCIFEROL) 1.25 MG (50000 UNIT) PO CAPS: 50000.0000 [IU] | ORAL_CAPSULE | ORAL | 0 refills | Status: DC

## 2017-10-10 NOTE — Progress Notes (Signed)
Office: [phone]  /  Fax: [phone]   HPI:   Chief Complaint: OBESITY Nicholas Flores is here to discuss his progress with his obesity treatment plan. He is on the Category 3 plan + breakfast options and is following his eating plan approximately 93 % of the time. He states he is walking and lifting weights for 90 minutes 2-3 times per week. Nicholas Flores traveled on vacation and at conference and did well with portion control and smarter food choices. He has gotten back on track and is doing well.  His weight is 243 lb (110.2 kg) today and has not lost weight since his last visit. He has lost 20 lbs since starting treatment with [name].  Vitamin D Deficiency Nicholas Flores has a diagnosis of vitamin D deficiency. He is stable on prescription Vit D, not yet at goal. He denies nausea, vomiting or muscle weakness.  At risk for osteopenia and osteoporosis Nicholas Flores is at higher risk of osteopenia and osteoporosis due to vitamin D deficiency.   Migraine Headache Nicholas Flores notes a decrease in migraine headaches since losing weight and is feeling well.  ALLERGIES: Allergies  Allergen Reactions  . Penicillins Rash  . Botox [Botulinum Toxin Type A]     Blurred vision    MEDICATIONS: Current Outpatient Medications on File Prior to Visit  Medication Sig Dispense Refill  . Blood Pressure Monitoring (BLOOD PRESSURE MONITOR 7) DEVI U UTD  0  . HYDROcodone-acetaminophen (NORCO) 10-325 MG tablet Take 1 tablet by mouth every 4 (four) hours as needed. 30 tablet 0  . mometasone (NASONEX) 50 MCG/ACT nasal spray USE 2 SPRAYS EACH NOSTRIL EVERY DAY AS NEEDED 17 g 11  . simvastatin (ZOCOR) 10 MG tablet TAKE 1 TABLET (10 MG TOTAL) BY MOUTH AT BEDTIME. 90 tablet 1  . SYSTANE ULTRA PF 0.4-0.3 % SOLN INSTILL ONE DROP INTO RIGHT EYE 4 TIMES A DAY  4  . tamsulosin (FLOMAX) 0.4 MG CAPS capsule Take 1 capsule (0.4 mg total) by mouth 2 (two) times daily. 180 capsule 3   No current facility-administered medications on file prior to  visit.     PAST MEDICAL HISTORY: Past Medical History:  Diagnosis Date  . BPH (benign prostatic hyperplasia)   . Migraines   . Over weight     PAST SURGICAL HISTORY: Past Surgical History:  Procedure Laterality Date  . BUNIONECTOMY    . Left knee surgery    . Left quadricep surgery    . TONSILLECTOMY      SOCIAL HISTORY: Social History   Tobacco Use  . Smoking status: Former Smoker    Types: Cigars  . Smokeless tobacco: Never Used  Substance Use Topics  . Alcohol use: Yes    Alcohol/week: 7.8 oz    Types: 1 Glasses of wine, 12 Standard drinks or equivalent per week  . Drug use: No    FAMILY HISTORY: Family History  Problem Relation Age of Onset  . Stroke Mother   . Diabetes Mother   . Hypertension Mother   . Obesity Mother     ROS: Review of Systems  Constitutional: Negative for weight loss.  Gastrointestinal: Negative for nausea and vomiting.  Musculoskeletal:       Negative muscle weakness  Neurological: Positive for headaches.    PHYSICAL EXAM: Blood pressure 117/72, pulse 81, temperature 97.7 F (36.5 C), temperature source Oral, height  (1.702 m), weight 243 lb (110.2 kg), SpO2 97 %. Body mass index is 38.06 kg/m. Physical Exam  Constitutional: He is  oriented to person, place, and time. He appears well-developed and well-nourished.  Cardiovascular: Normal rate.  Pulmonary/Chest: Effort normal.  Musculoskeletal: Normal range of motion.  Neurological: He is oriented to person, place, and time.  Skin: Skin is warm and dry.  Psychiatric: He has a normal mood and affect. His behavior is normal.  Vitals reviewed.   RECENT LABS AND TESTS: BMET    Component Value Date/Time   NA 141 07/23/2017 0943   NA 142 08/18/2011 0527   K 4.2 07/23/2017 0943   K 3.5 08/18/2011 0527   CL 105 07/23/2017 0943   CL 110 (H) 08/18/2011 0527   CO2 22 07/23/2017 0943   CO2 21 08/18/2011 0527   GLUCOSE 94 07/23/2017 0943   GLUCOSE 91 05/02/2017 0945    GLUCOSE 86 08/18/2011 0527   BUN 14 07/23/2017 0943   BUN 13 08/18/2011 0527   CREATININE 1.06 07/23/2017 0943   CREATININE 1.23 05/02/2017 0945   CALCIUM 9.1 07/23/2017 0943   CALCIUM 7.8 (L) 08/18/2011 0527   GFRNONAA 75 07/23/2017 0943   GFRNONAA 63 05/02/2017 0945   GFRAA 87 07/23/2017 0943   GFRAA 73 05/02/2017 0945   Lab Results  Component Value Date   HGBA1C 5.8 (H) 07/23/2017   Lab Results  Component Value Date   INSULIN 34.0 (H) 07/23/2017   CBC    Component Value Date/Time   WBC 3.1 (L) 07/23/2017 0943   WBC 4.8 05/02/2017 0945   RBC 4.59 07/23/2017 0943   RBC 4.54 05/02/2017 0945   HGB 13.5 07/23/2017 0943   HCT 40.3 07/23/2017 0943   PLT 196 05/02/2017 0945   PLT 126 (L) 08/18/2011 0527   MCV 88 07/23/2017 0943   MCV 91 08/18/2011 0527   MCH 29.4 07/23/2017 0943   MCH 29.5 05/02/2017 0945   MCHC 33.5 07/23/2017 0943   MCHC 33.8 05/02/2017 0945   RDW 14.8 07/23/2017 0943   RDW 14.5 08/18/2011 0527   LYMPHSABS 0.8 07/23/2017 0943   LYMPHSABS 0.8 (L) 08/18/2011 0527   MONOABS 380 04/27/2016 1120   MONOABS 0.7 08/18/2011 0527   EOSABS 0.1 07/23/2017 0943   EOSABS 0.0 08/18/2011 0527   BASOSABS 0.0 07/23/2017 0943   BASOSABS 0.0 08/18/2011 0527   Iron/TIBC/Ferritin/ %Sat No results found for: IRON, TIBC, FERRITIN, IRONPCTSAT Lipid Panel     Component Value Date/Time   CHOL 158 07/23/2017 0943   TRIG 52 07/23/2017 0943   HDL 50 07/23/2017 0943   CHOLHDL 2.8 05/02/2017 0945   VLDL 12 10/26/2016 1216   LDLCALC 98 07/23/2017 0943   LDLCALC 95 05/02/2017 0945   Hepatic Function Panel     Component Value Date/Time   PROT 7.1 07/23/2017 0943   PROT 6.2 (L) 08/18/2011 0527   ALBUMIN 4.4 07/23/2017 0943   ALBUMIN 2.9 (L) 08/18/2011 0527   AST 18 07/23/2017 0943   AST 18 08/18/2011 0527   ALT 21 07/23/2017 0943   ALT 25 08/18/2011 0527   ALKPHOS 65 07/23/2017 0943   ALKPHOS 56 08/18/2011 0527   BILITOT 0.2 07/23/2017 0943   BILITOT 0.3  08/18/2011 0527   BILIDIR 0.1 10/26/2016 1216   IBILI 0.4 10/26/2016 1216      Component Value Date/Time   TSH 6.250 (H) 07/23/2017 0943  Results for Nicholas Flores, Nicholas Flores "Nicholas Flores" (MRN [id number]) as of 10/10/2017 15:15  Ref. Range 07/23/2017 09:43  Vitamin D, 25-Hydroxy Latest Ref Range: 30.0 - 100.0 ng/mL 9.5 (L)    ASSESSMENT AND PLAN: Vitamin D  deficiency - Plan: Vitamin D, Ergocalciferol, (DRISDOL) 50000 units CAPS capsule  Other migraine without status migrainosus, intractable  At risk for osteoporosis  Class 2 severe obesity with serious comorbidity and body mass index (BMI) of 38.0 to 38.9 in adult, unspecified obesity type (HCC)  PLAN:  Vitamin D Deficiency Nicholas Flores was informed that low vitamin D levels contributes to fatigue and are associated with obesity, breast, and colon cancer. Worthy agrees to continue taking prescription Vit D ,000 IU every week #4 and we will refill for 1 month. He will follow up for routine testing of vitamin D, at least 2-3 times per year. He was informed of the risk of over-replacement of vitamin D and agrees to not increase his dose unless he discusses this with [name] first. Autumn agrees to follow up with our clinic in 3 weeks.  At risk for osteopenia and osteoporosis Nicholas Flores is at risk for osteopenia and osteoporsis due to his vitamin D deficiency. He was encouraged to take his vitamin D and follow his higher calcium diet and increase strengthening exercise to help strengthen his bones and decrease his risk of osteopenia and osteoporosis.  Migraine Headache Nicholas Flores is to continue with weight loss and increase H20 intake. Nicholas Flores agrees to follow up with our clinic in 3 weeks.  Obesity Nicholas Flores is currently in the action stage of change. As such, his goal is to continue with weight loss efforts He has agreed to follow the Category 3 plan Nicholas Flores has been instructed to work up to a goal of 150 minutes of combined cardio and strengthening exercise per week for  weight loss and overall health benefits. We discussed the following Behavioral Modification Strategies today: increasing lean protein intake and decreasing simple carbohydrates    Nicholas Flores has agreed to follow up with our clinic in 3 weeks. He was informed of the importance of frequent follow up visits to maximize his success with intensive lifestyle modifications for his multiple health conditions.   OBESITY BEHAVIORAL INTERVENTION VISIT  Today's visit was # 5 out of 22.  Starting weight: 263 lbs Starting date: 07/23/17 Today's weight : 243 lbs  Today's date: 10/09/2017 Total lbs lost to date: 20 (Patients must lose 7 lbs in the first 6 months to continue with counseling)   ASK: We discussed the diagnosis of obesity with Nicholas Flores today and Nicholas Flores agreed to give [name] permission to discuss obesity behavioral modification therapy today.  ASSESS: Nicholas Flores has the diagnosis of obesity and his BMI today is 38.05 Nicholas Flores is in the action stage of change   ADVISE: Nicholas Flores was educated on the multiple health risks of obesity as well as the benefit of weight loss to improve his health. He was advised of the need for long term treatment and the importance of lifestyle modifications.  AGREE: Multiple dietary modification options and treatment options were discussed and  Nicholas Flores agreed to the above obesity treatment plan.  I, Burt Knack, am acting as transcriptionist for Quillian Quince, MD  I have reviewed the above documentation for accuracy and completeness, and I agree with the above. -Quillian Quince, MD

## 2017-10-16 ENCOUNTER — Other Ambulatory Visit: Payer: Self-pay | Admitting: Internal Medicine

## 2017-10-16 DIAGNOSIS — E785 Hyperlipidemia, unspecified: Secondary | ICD-10-CM

## 2017-10-16 DIAGNOSIS — Z79899 Other long term (current) drug therapy: Secondary | ICD-10-CM

## 2017-10-16 DIAGNOSIS — Z5181 Encounter for therapeutic drug level monitoring: Secondary | ICD-10-CM

## 2017-10-22 ENCOUNTER — Other Ambulatory Visit: Payer: Self-pay | Admitting: Internal Medicine

## 2017-10-28 ENCOUNTER — Other Ambulatory Visit: Payer: BC Managed Care – PPO | Admitting: Internal Medicine

## 2017-10-28 DIAGNOSIS — E785 Hyperlipidemia, unspecified: Secondary | ICD-10-CM

## 2017-10-28 DIAGNOSIS — Z79899 Other long term (current) drug therapy: Secondary | ICD-10-CM

## 2017-10-28 DIAGNOSIS — Z5181 Encounter for therapeutic drug level monitoring: Secondary | ICD-10-CM

## 2017-10-29 ENCOUNTER — Other Ambulatory Visit (INDEPENDENT_AMBULATORY_CARE_PROVIDER_SITE_OTHER): Payer: Self-pay | Admitting: Family Medicine

## 2017-10-29 DIAGNOSIS — E559 Vitamin D deficiency, unspecified: Secondary | ICD-10-CM

## 2017-10-30 ENCOUNTER — Ambulatory Visit (INDEPENDENT_AMBULATORY_CARE_PROVIDER_SITE_OTHER): Payer: BC Managed Care – PPO | Admitting: Family Medicine

## 2017-11-01 ENCOUNTER — Encounter: Payer: Self-pay | Admitting: Internal Medicine

## 2017-11-01 ENCOUNTER — Ambulatory Visit (INDEPENDENT_AMBULATORY_CARE_PROVIDER_SITE_OTHER): Payer: BC Managed Care – PPO | Admitting: Internal Medicine

## 2017-11-01 VITALS — BP 110/70 | HR 86 | Temp 98.2°F | Ht 67.0 in | Wt 241.0 lb

## 2017-11-01 DIAGNOSIS — E785 Hyperlipidemia, unspecified: Secondary | ICD-10-CM | POA: Diagnosis not present

## 2017-11-01 DIAGNOSIS — Z6837 Body mass index (BMI) 37.0-37.9, adult: Secondary | ICD-10-CM | POA: Diagnosis not present

## 2017-11-01 DIAGNOSIS — E039 Hypothyroidism, unspecified: Secondary | ICD-10-CM

## 2017-11-02 ENCOUNTER — Other Ambulatory Visit (INDEPENDENT_AMBULATORY_CARE_PROVIDER_SITE_OTHER): Payer: Self-pay | Admitting: Family Medicine

## 2017-11-02 DIAGNOSIS — E559 Vitamin D deficiency, unspecified: Secondary | ICD-10-CM

## 2017-11-04 LAB — HEPATIC FUNCTION PANEL
AG Ratio: 1.5 (calc) (ref 1.0–2.5)
ALKALINE PHOSPHATASE (APISO): 60 U/L (ref 40–115)
ALT: 24 U/L (ref 9–46)
AST: 20 U/L (ref 10–35)
Albumin: 4.3 g/dL (ref 3.6–5.1)
BILIRUBIN TOTAL: 0.4 mg/dL (ref 0.2–1.2)
Bilirubin, Direct: 0.1 mg/dL (ref 0.0–0.2)
Globulin: 2.9 g/dL (calc) (ref 1.9–3.7)
Indirect Bilirubin: 0.3 mg/dL (calc) (ref 0.2–1.2)
Total Protein: 7.2 g/dL (ref 6.1–8.1)

## 2017-11-04 LAB — TEST AUTHORIZATION

## 2017-11-04 LAB — LIPID PANEL
CHOL/HDL RATIO: 3.3 (calc) (ref <5.0)
CHOLESTEROL: 160 mg/dL (ref <200)
HDL: 49 mg/dL (ref >40)
LDL CHOLESTEROL (CALC): 97 mg/dL
Non-HDL Cholesterol (Calc): 111 mg/dL (calc) (ref <130)
Triglycerides: 49 mg/dL (ref <150)

## 2017-11-04 LAB — TSH: TSH: 5.84 mIU/L — ABNORMAL HIGH (ref 0.40–4.50)

## 2017-11-04 MED ORDER — VITAMIN D (ERGOCALCIFEROL) 1.25 MG (50000 UNIT) PO CAPS: 50000.0000 [IU] | ORAL_CAPSULE | ORAL | 0 refills | Status: DC

## 2017-11-08 ENCOUNTER — Telehealth: Payer: Self-pay

## 2017-11-08 NOTE — Telephone Encounter (Signed)
Patient called is requesting TSH results, he said if he needs to start a medication to call him first to get pharmacy info.

## 2017-11-08 NOTE — Telephone Encounter (Signed)
It looks like the TSH you requested as an add on was never run by lab. Please call and ask them why this was not done. Unfortunately patient is going to need to come back and get TSH drawn. The reason is elevated TSH

## 2017-11-11 MED ORDER — LEVOTHYROXINE SODIUM 50 MCG PO TABS: 50.0000 ug | ORAL_TABLET | Freq: Every day | ORAL | 1 refills | Status: DC

## 2017-11-11 NOTE — Progress Notes (Signed)
   Subjective:    Patient ID: Nicholas Flores, male    DOB: 19-Jan-1956, 62 y.o.   MRN: [id number]  HPI 62 year old Male for 6 month follow up. At last visit in March, had elevated TSH. This will be repeated. Also, here for follow up of hyperlipidemia.  Lipid panel and liver functions are normal on Zocor 10 mg daily.  He is also seeing Dr. Dalbert GarnetBeasley for weight loss and is done quite well with that.  He feels better.  He has more energy.  BMI today is 37.75 and he weighs 241 pounds.  In December he weighed 272 pounds.    Review of Systems see above     Objective:   Physical Exam  Neck supple without thyromegaly.  Chest clear.  Cardiac exam regular rate and rhythm.  Extremities without edema      Assessment & Plan:  Elevated TSH-to be repeated  Hyperlipidemia-stable on low-dose statin medication and liver functions are normal  History of vitamin D deficiency treated by Dr. Dalbert GarnetBeasley  BMI 37.75.  Has lost 32 pounds since December.  Plan: Await TSH results.  Physical exam due in 6 months.  Addendum: TSH is elevated he will be started on levothyroxine 0.05 mg daily with follow-up in 6 weeks.

## 2017-11-11 NOTE — Telephone Encounter (Signed)
Called pt regarding TSH elevation. This has been noted twice now. We are going to start him on thyroid replacement med with follow up TSH in 6 weeks. He is out of town. Can start med when he returns on Saturday. Told him to take it on empty stomach without food or other meds.

## 2017-11-11 NOTE — Patient Instructions (Signed)
Continue statin medication.  Begin levothyroxine 0.05 mg daily and follow-up in 6 weeks.  Graduations on weight loss.

## 2017-11-18 ENCOUNTER — Ambulatory Visit (INDEPENDENT_AMBULATORY_CARE_PROVIDER_SITE_OTHER): Payer: BC Managed Care – PPO | Admitting: Family Medicine

## 2017-11-18 ENCOUNTER — Ambulatory Visit: Payer: BC Managed Care – PPO | Admitting: Podiatry

## 2017-11-18 VITALS — BP 123/70 | HR 86 | Ht 67.0 in | Wt 240.0 lb

## 2017-11-18 DIAGNOSIS — Z6837 Body mass index (BMI) 37.0-37.9, adult: Secondary | ICD-10-CM

## 2017-11-18 DIAGNOSIS — Z9189 Other specified personal risk factors, not elsewhere classified: Secondary | ICD-10-CM | POA: Diagnosis not present

## 2017-11-18 DIAGNOSIS — E559 Vitamin D deficiency, unspecified: Secondary | ICD-10-CM | POA: Diagnosis not present

## 2017-11-18 MED ORDER — VITAMIN D (ERGOCALCIFEROL) 1.25 MG (50000 UNIT) PO CAPS: 50000.0000 [IU] | ORAL_CAPSULE | ORAL | 0 refills | Status: DC

## 2017-11-20 NOTE — Progress Notes (Signed)
Office: [phone]  /  Fax: [phone]   HPI:   Chief Complaint: OBESITY Nicholas Flores is here to discuss his progress with his obesity treatment plan. He is on the Category 3 plan and is following his eating plan approximately 75 % of the time. He states he is exercising 60 minutes 3-5 times per week. Nicholas Flores continues to do well with weight loss even with increased work stress and temptations. Hunger is controlled but meal planning hasn't gone as well as usual.  His weight is 240 lb (108.9 kg) today and has had a weight loss of 3 pounds over a period of 5 to 6 weeks since his last visit. He has lost 23 lbs since starting treatment with [name].  Vitamin D Deficiency Nicholas Flores has a diagnosis of vitamin D deficiency. He is stable on prescription Vit D, not yet at goal. He denies nausea, vomiting or muscle weakness, and he notes decreased fatigue.  At risk for cardiovascular disease Nicholas Flores is at a higher than average risk for cardiovascular disease due to obesity. He currently denies any chest pain.  ALLERGIES: Allergies  Allergen Reactions  . Penicillins Rash  . Botox [Botulinum Toxin Type A]     Blurred vision    MEDICATIONS: Current Outpatient Medications on File Prior to Visit  Medication Sig Dispense Refill  . Blood Pressure Monitoring (BLOOD PRESSURE MONITOR 7) DEVI U UTD  0  . HYDROcodone-acetaminophen (NORCO) 10-325 MG tablet Take 1 tablet by mouth every 4 (four) hours as needed. 30 tablet 0  . levothyroxine (SYNTHROID, LEVOTHROID) 50 MCG tablet Take 1 tablet (50 mcg total) by mouth daily. 30 tablet 1  . mometasone (NASONEX) 50 MCG/ACT nasal spray USE 2 SPRAYS EACH NOSTRIL EVERY DAY AS NEEDED 17 g 11  . simvastatin (ZOCOR) 10 MG tablet TAKE 1 TABLET (10 MG TOTAL) BY MOUTH AT BEDTIME. 90 tablet 1  . SYSTANE ULTRA PF 0.4-0.3 % SOLN INSTILL ONE DROP INTO RIGHT EYE 4 TIMES A DAY  4  . tamsulosin (FLOMAX) 0.4 MG CAPS capsule Take 1 capsule (0.4 mg total) by mouth 2 (two) times daily. 180  capsule 3   No current facility-administered medications on file prior to visit.     PAST MEDICAL HISTORY: Past Medical History:  Diagnosis Date  . BPH (benign prostatic hyperplasia)   . Migraines   . Over weight     PAST SURGICAL HISTORY: Past Surgical History:  Procedure Laterality Date  . BUNIONECTOMY    . Left knee surgery    . Left quadricep surgery    . TONSILLECTOMY      SOCIAL HISTORY: Social History   Tobacco Use  . Smoking status: Former Smoker    Types: Cigars  . Smokeless tobacco: Never Used  Substance Use Topics  . Alcohol use: Yes    Alcohol/week: 7.8 oz    Types: 1 Glasses of wine, 12 Standard drinks or equivalent per week  . Drug use: No    FAMILY HISTORY: Family History  Problem Relation Age of Onset  . Stroke Mother   . Diabetes Mother   . Hypertension Mother   . Obesity Mother     ROS: Review of Systems  Constitutional: Positive for malaise/fatigue and weight loss.  Cardiovascular: Negative for chest pain.  Gastrointestinal: Negative for nausea and vomiting.  Musculoskeletal:       Negative muscle weakness    PHYSICAL EXAM: Blood pressure 123/70, pulse 86, height 5\' 7"  (1.702 m), weight 240 lb (108.9 kg), SpO2 98 %.  Body mass index is 37.59 kg/m. Physical Exam  Constitutional: He is oriented to person, place, and time. He appears well-developed and well-nourished.  Cardiovascular: Normal rate.  Pulmonary/Chest: Effort normal.  Musculoskeletal: Normal range of motion.  Neurological: He is oriented to person, place, and time.  Skin: Skin is warm and dry.  Psychiatric: He has a normal mood and affect. His behavior is normal.  Vitals reviewed.   RECENT LABS AND TESTS: BMET    Component Value Date/Time   NA 141 07/23/2017 0943   NA 142 08/18/2011 0527   K 4.2 07/23/2017 0943   K 3.5 08/18/2011 0527   CL 105 07/23/2017 0943   CL 110 (H) 08/18/2011 0527   CO2 22 07/23/2017 0943   CO2 21 08/18/2011 0527   GLUCOSE 94  07/23/2017 0943   GLUCOSE 91 05/02/2017 0945   GLUCOSE 86 08/18/2011 0527   BUN 14 07/23/2017 0943   BUN 13 08/18/2011 0527   CREATININE 1.06 07/23/2017 0943   CREATININE 1.23 05/02/2017 0945   CALCIUM 9.1 07/23/2017 0943   CALCIUM 7.8 (L) 08/18/2011 0527   GFRNONAA 75 07/23/2017 0943   GFRNONAA 63 05/02/2017 0945   GFRAA 87 07/23/2017 0943   GFRAA 73 05/02/2017 0945   Lab Results  Component Value Date   HGBA1C 5.8 (H) 07/23/2017   Lab Results  Component Value Date   INSULIN 34.0 (H) 07/23/2017   CBC    Component Value Date/Time   WBC 3.1 (L) 07/23/2017 0943   WBC 4.8 05/02/2017 0945   RBC 4.59 07/23/2017 0943   RBC 4.54 05/02/2017 0945   HGB 13.5 07/23/2017 0943   HCT 40.3 07/23/2017 0943   PLT 196 05/02/2017 0945   PLT 126 (L) 08/18/2011 0527   MCV 88 07/23/2017 0943   MCV 91 08/18/2011 0527   MCH 29.4 07/23/2017 0943   MCH 29.5 05/02/2017 0945   MCHC 33.5 07/23/2017 0943   MCHC 33.8 05/02/2017 0945   RDW 14.8 07/23/2017 0943   RDW 14.5 08/18/2011 0527   LYMPHSABS 0.8 07/23/2017 0943   LYMPHSABS 0.8 (L) 08/18/2011 0527   MONOABS 380 04/27/2016 1120   MONOABS 0.7 08/18/2011 0527   EOSABS 0.1 07/23/2017 0943   EOSABS 0.0 08/18/2011 0527   BASOSABS 0.0 07/23/2017 0943   BASOSABS 0.0 08/18/2011 0527   Iron/TIBC/Ferritin/ %Sat No results found for: IRON, TIBC, FERRITIN, IRONPCTSAT Lipid Panel     Component Value Date/Time   CHOL 160 10/28/2017 1216   CHOL 158 07/23/2017 0943   TRIG 49 10/28/2017 1216   HDL 49 10/28/2017 1216   HDL 50 07/23/2017 0943   CHOLHDL 3.3 10/28/2017 1216   VLDL 12 10/26/2016 1216   LDLCALC 97 10/28/2017 1216   Hepatic Function Panel     Component Value Date/Time   PROT 7.2 10/28/2017 1216   PROT 7.1 07/23/2017 0943   PROT 6.2 (L) 08/18/2011 0527   ALBUMIN 4.4 07/23/2017 0943   ALBUMIN 2.9 (L) 08/18/2011 0527   AST 20 10/28/2017 1216   AST 18 08/18/2011 0527   ALT 24 10/28/2017 1216   ALT 25 08/18/2011 0527   ALKPHOS 65  07/23/2017 0943   ALKPHOS 56 08/18/2011 0527   BILITOT 0.4 10/28/2017 1216   BILITOT 0.2 07/23/2017 0943   BILITOT 0.3 08/18/2011 0527   BILIDIR 0.1 10/28/2017 1216   IBILI 0.3 10/28/2017 1216      Component Value Date/Time   TSH 5.84 (H) 10/28/2017 1216   TSH 6.250 (H) 07/23/2017 0943  Results  for Marlou PorchBOSWELL, Nicholas W "BOZ" (MRN [id number]) as of 11/20/2017 15:02  Ref. Range 07/23/2017 09:43  Vitamin D, 25-Hydroxy Latest Ref Range: 30.0 - 100.0 ng/mL 9.5 (L)    ASSESSMENT AND PLAN: Vitamin D deficiency - Plan: Vitamin D, Ergocalciferol, (DRISDOL) 50000 units CAPS capsule  At risk for heart disease  Class 2 severe obesity with serious comorbidity and body mass index (BMI) of 37.0 to 37.9 in adult, unspecified obesity type (HCC)  PLAN:  Vitamin D Deficiency Nicholas Flores was informed that low vitamin D levels contributes to fatigue and are associated with obesity, breast, and colon cancer. Nicholas Flores agrees to continue taking prescription Vit D @50 ,000 IU every week #4 and we will refill for 1 month. He will follow up for routine testing of vitamin D, at least 2-3 times per year. He was informed of the risk of over-replacement of vitamin D and agrees to not increase his dose unless he discusses this with us first. Nicholas Flores agrees to follow up with our clinic in 3 to 4 weeks.  Cardiovascular risk counselling Nicholas Flores was given extended (15 minutes) coronary artery disease prevention counseling today. He is 62 y.o. male and has risk factors for heart disease including obesity. We discussed intensive lifestyle modifications today with an emphasis on specific weight loss instructions and strategies. Pt was also informed of the importance of increasing exercise and decreasing saturated fats to help prevent heart disease.  Obesity Nicholas Flores is currently in the action stage of change. As such, his goal is to continue with weight loss efforts He has agreed to follow the Category 3 plan Nicholas Flores has been instructed to  work up to a goal of 150 minutes of combined cardio and strengthening exercise per week for weight loss and overall health benefits. We discussed the following Behavioral Modification Strategies today: increasing lean protein intake, decreasing simple carbohydrates  and work on meal planning and easy cooking plans   Nicholas Flores has agreed to follow up with our clinic in 3 to 4 weeks. He was informed of the importance of frequent follow up visits to maximize his success with intensive lifestyle modifications for his multiple health conditions.   OBESITY BEHAVIORAL INTERVENTION VISIT  Today's visit was # 6 out of 22.  Starting weight: 263 lbs Starting date: 07/23/17 Today's weight : 240 lbs  Today's date: 11/18/2017 Total lbs lost to date: 23 (Patients must lose 7 lbs in the first 6 months to continue with counseling)   ASK: We discussed the diagnosis of obesity with Nicholas BellingMorris W Flores today and Kaelyn agreed to give us permission to discuss obesity behavioral modification therapy today.  ASSESS: Kedrick has the diagnosis of obesity and his BMI today is 3737.58 Shaquan is in the action stage of change   ADVISE: Nicholas Flores was educated on the multiple health risks of obesity as well as the benefit of weight loss to improve his health. He was advised of the need for long term treatment and the importance of lifestyle modifications.  AGREE: Multiple dietary modification options and treatment options were discussed and  Markevius agreed to the above obesity treatment plan.  I, Burt KnackSharon Martin, am acting as transcriptionist for Quillian Quincearen Kestrel Mis, MD  I have reviewed the above documentation for accuracy and completeness, and I agree with the above. -Quillian Quincearen Trine Fread, MD

## 2017-11-26 ENCOUNTER — Other Ambulatory Visit (INDEPENDENT_AMBULATORY_CARE_PROVIDER_SITE_OTHER): Payer: Self-pay | Admitting: Family Medicine

## 2017-11-26 DIAGNOSIS — E559 Vitamin D deficiency, unspecified: Secondary | ICD-10-CM

## 2017-11-29 ENCOUNTER — Ambulatory Visit: Payer: BC Managed Care – PPO | Admitting: Podiatry

## 2017-11-29 ENCOUNTER — Encounter

## 2017-12-11 ENCOUNTER — Other Ambulatory Visit: Payer: Self-pay | Admitting: Internal Medicine

## 2017-12-11 NOTE — Telephone Encounter (Signed)
Patient has 1 refill on this medication already.  He will call his pharmacy and have this filled.    Scheduled for patient to come in and have a TSH drawn on 8/16 between 9:00 - 12:00.  We want to make sure that his levels are getting back down to a therapeutic level.  Patient verbalized understanding of these instructions.

## 2017-12-16 ENCOUNTER — Ambulatory Visit (INDEPENDENT_AMBULATORY_CARE_PROVIDER_SITE_OTHER): Payer: BC Managed Care – PPO | Admitting: Family Medicine

## 2017-12-16 VITALS — BP 133/72 | HR 68 | Temp 97.9°F | Ht 67.0 in | Wt 242.0 lb

## 2017-12-16 DIAGNOSIS — Z6837 Body mass index (BMI) 37.0-37.9, adult: Secondary | ICD-10-CM | POA: Diagnosis not present

## 2017-12-16 DIAGNOSIS — Z9189 Other specified personal risk factors, not elsewhere classified: Secondary | ICD-10-CM

## 2017-12-16 DIAGNOSIS — E559 Vitamin D deficiency, unspecified: Secondary | ICD-10-CM

## 2017-12-16 MED ORDER — VITAMIN D (ERGOCALCIFEROL) 1.25 MG (50000 UNIT) PO CAPS
50000.0000 [IU] | ORAL_CAPSULE | ORAL | 0 refills | Status: DC
Start: 2017-12-16 — End: 2018-01-02

## 2017-12-17 NOTE — Progress Notes (Signed)
Office: [phone]  /  Fax: [phone]   HPI:   Chief Complaint: OBESITY Nicholas Flores is here to discuss his progress with his obesity treatment plan. He is on the Category 3 plan and is following his eating plan approximately 90 to 93 % of the time. He states he is doing cardio and weights for 60 to 75 minutes 3 times per week. Nicholas Flores had increased celebration eating and increased temptations over the last 2 to 3 weeks, but states he is ready to get back on trak. He likes the breakfast and lunch on the category 3 plan, but he would like more options for dinner. His weight is 242 lb (109.8 kg) today and has had a weight gain of 2 pounds over a period of 4 weeks since his last visit. He has lost 21 lbs since starting treatment with us.  Vitamin D deficiency Nicholas Flores has a diagnosis of vitamin D deficiency. He is stable on vit D. He is not yet at goal and he denies nausea, vomiting or muscle weakness.  At risk for osteopenia and osteoporosis Nicholas Flores is at higher risk of osteopenia and osteoporosis due to vitamin D deficiency.   ALLERGIES: Allergies  Allergen Reactions  . Penicillins Rash  . Botox [Botulinum Toxin Type A]     Blurred vision    MEDICATIONS: Current Outpatient Medications on File Prior to Visit  Medication Sig Dispense Refill  . Blood Pressure Monitoring (BLOOD PRESSURE MONITOR 7) DEVI U UTD  0  . HYDROcodone-acetaminophen (NORCO) 10-325 MG tablet Take 1 tablet by mouth every 4 (four) hours as needed. 30 tablet 0  . levothyroxine (SYNTHROID, LEVOTHROID) 50 MCG tablet TAKE 1 TABLET BY MOUTH EVERY DAY 30 tablet 1  . mometasone (NASONEX) 50 MCG/ACT nasal spray USE 2 SPRAYS EACH NOSTRIL EVERY DAY AS NEEDED 17 g 11  . simvastatin (ZOCOR) 10 MG tablet TAKE 1 TABLET (10 MG TOTAL) BY MOUTH AT BEDTIME. 90 tablet 1  . SYSTANE ULTRA PF 0.4-0.3 % SOLN INSTILL ONE DROP INTO RIGHT EYE 4 TIMES A DAY  4  . tamsulosin (FLOMAX) 0.4 MG CAPS capsule Take 1 capsule (0.4 mg total) by mouth 2  (two) times daily. 180 capsule 3   No current facility-administered medications on file prior to visit.     PAST MEDICAL HISTORY: Past Medical History:  Diagnosis Date  . BPH (benign prostatic hyperplasia)   . Migraines   . Over weight     PAST SURGICAL HISTORY: Past Surgical History:  Procedure Laterality Date  . BUNIONECTOMY    . Left knee surgery    . Left quadricep surgery    . TONSILLECTOMY      SOCIAL HISTORY: Social History   Tobacco Use  . Smoking status: Former Smoker    Types: Cigars  . Smokeless tobacco: Never Used  Substance Use Topics  . Alcohol use: Yes    Alcohol/week: 7.8 oz    Types: 1 Glasses of wine, 12 Standard drinks or equivalent per week  . Drug use: No    FAMILY HISTORY: Family History  Problem Relation Age of Onset  . Stroke Mother   . Diabetes Mother   . Hypertension Mother   . Obesity Mother     ROS: Review of Systems  Constitutional: Negative for weight loss.  Gastrointestinal: Negative for nausea and vomiting.  Musculoskeletal:       Negative for muscle weakness    PHYSICAL EXAM: Blood pressure 133/72, pulse 68, temperature 97.9 F (36.6 C), temperature source  Oral, height 5\' 7"  (1.702 m), weight 242 lb (109.8 kg), SpO2 98 %. Body mass index is 37.9 kg/m. Physical Exam  Constitutional: He is oriented to person, place, and time. He appears well-developed and well-nourished.  Cardiovascular: Normal rate.  Pulmonary/Chest: Effort normal.  Musculoskeletal: Normal range of motion.  Neurological: He is oriented to person, place, and time.  Skin: Skin is warm and dry.  Psychiatric: He has a normal mood and affect. His behavior is normal.  Vitals reviewed.   RECENT LABS AND TESTS: BMET    Component Value Date/Time   NA 141 07/23/2017 0943   NA 142 08/18/2011 0527   K 4.2 07/23/2017 0943   K 3.5 08/18/2011 0527   CL 105 07/23/2017 0943   CL 110 (H) 08/18/2011 0527   CO2 22 07/23/2017 0943   CO2 21 08/18/2011 0527    GLUCOSE 94 07/23/2017 0943   GLUCOSE 91 05/02/2017 0945   GLUCOSE 86 08/18/2011 0527   BUN 14 07/23/2017 0943   BUN 13 08/18/2011 0527   CREATININE 1.06 07/23/2017 0943   CREATININE 1.23 05/02/2017 0945   CALCIUM 9.1 07/23/2017 0943   CALCIUM 7.8 (L) 08/18/2011 0527   GFRNONAA 75 07/23/2017 0943   GFRNONAA 63 05/02/2017 0945   GFRAA 87 07/23/2017 0943   GFRAA 73 05/02/2017 0945   Lab Results  Component Value Date   HGBA1C 5.8 (H) 07/23/2017   Lab Results  Component Value Date   INSULIN 34.0 (H) 07/23/2017   CBC    Component Value Date/Time   WBC 3.1 (L) 07/23/2017 0943   WBC 4.8 05/02/2017 0945   RBC 4.59 07/23/2017 0943   RBC 4.54 05/02/2017 0945   HGB 13.5 07/23/2017 0943   HCT 40.3 07/23/2017 0943   PLT 196 05/02/2017 0945   PLT 126 (L) 08/18/2011 0527   MCV 88 07/23/2017 0943   MCV 91 08/18/2011 0527   MCH 29.4 07/23/2017 0943   MCH 29.5 05/02/2017 0945   MCHC 33.5 07/23/2017 0943   MCHC 33.8 05/02/2017 0945   RDW 14.8 07/23/2017 0943   RDW 14.5 08/18/2011 0527   LYMPHSABS 0.8 07/23/2017 0943   LYMPHSABS 0.8 (L) 08/18/2011 0527   MONOABS 380 04/27/2016 1120   MONOABS 0.7 08/18/2011 0527   EOSABS 0.1 07/23/2017 0943   EOSABS 0.0 08/18/2011 0527   BASOSABS 0.0 07/23/2017 0943   BASOSABS 0.0 08/18/2011 0527   Iron/TIBC/Ferritin/ %Sat No results found for: IRON, TIBC, FERRITIN, IRONPCTSAT Lipid Panel     Component Value Date/Time   CHOL 160 10/28/2017 1216   CHOL 158 07/23/2017 0943   TRIG 49 10/28/2017 1216   HDL 49 10/28/2017 1216   HDL 50 07/23/2017 0943   CHOLHDL 3.3 10/28/2017 1216   VLDL 12 10/26/2016 1216   LDLCALC 97 10/28/2017 1216   Hepatic Function Panel     Component Value Date/Time   PROT 7.2 10/28/2017 1216   PROT 7.1 07/23/2017 0943   PROT 6.2 (L) 08/18/2011 0527   ALBUMIN 4.4 07/23/2017 0943   ALBUMIN 2.9 (L) 08/18/2011 0527   AST 20 10/28/2017 1216   AST 18 08/18/2011 0527   ALT 24 10/28/2017 1216   ALT 25 08/18/2011 0527    ALKPHOS 65 07/23/2017 0943   ALKPHOS 56 08/18/2011 0527   BILITOT 0.4 10/28/2017 1216   BILITOT 0.2 07/23/2017 0943   BILITOT 0.3 08/18/2011 0527   BILIDIR 0.1 10/28/2017 1216   IBILI 0.3 10/28/2017 1216      Component Value Date/Time  TSH 5.84 (H) 10/28/2017 1216   TSH 6.250 (H) 07/23/2017 0943   Results for LONELL, STAMOS" (MRN [id number]) as of 12/17/2017 09:33  Ref. Range 07/23/2017 09:43  Vitamin D, 25-Hydroxy Latest Ref Range: 30.0 - 100.0 ng/mL 9.5 (L)   ASSESSMENT AND PLAN: Vitamin D deficiency - Plan: Vitamin D, Ergocalciferol, (DRISDOL) 50000 units CAPS capsule  At risk for osteoporosis  Class 2 severe obesity with serious comorbidity and body mass index (BMI) of 37.0 to 37.9 in adult, unspecified obesity type (HCC)  PLAN:  Vitamin D Deficiency Tavious was informed that low vitamin D levels contributes to fatigue and are associated with obesity, breast, and colon cancer. He agrees to continue to take prescription Vit D @50 ,000 IU every week #4 with no refills and will follow up for routine testing of vitamin D, at least 2-3 times per year. He was informed of the risk of over-replacement of vitamin D and agrees to not increase his dose unless he discusses this with [name] first. Nicholas Flores agrees to follow up as directed.  At risk for osteopenia and osteoporosis Nicholas Flores is at risk for osteopenia and osteoporosis due to his vitamin D deficiency. He was encouraged to take his vitamin D and follow his higher calcium diet and increase strengthening exercise to help strengthen his bones and decrease his risk of osteopenia and osteoporosis.  Obesity Nicholas Flores is currently in the action stage of change. As such, his goal is to continue with weight loss efforts He has agreed to keep a food journal with 400 to 600 calories and 70+ grams of protein at supper daily and follow the Category 3 plan Nicholas Flores has been instructed to work up to a goal of 150 minutes of combined cardio and  strengthening exercise per week for weight loss and overall health benefits. We discussed the following Behavioral Modification Strategies today: increasing lean protein intake, decreasing simple carbohydrates , decrease eating out and work on meal planning and easy cooking plans  Nicholas Flores has agreed to follow up with our clinic in 2 to 3 weeks fasting. He was informed of the importance of frequent follow up visits to maximize his success with intensive lifestyle modifications for his multiple health conditions.   OBESITY BEHAVIORAL INTERVENTION VISIT  Today's visit was # 7 out of 22.  Starting weight: 263 lbs Starting date: 07/23/17 Today's weight : 242 lbs Today's date: 12/16/2017 Total lbs lost to date: 21    ASK: We discussed the diagnosis of obesity with Nicholas Flores today and Nicholas Flores agreed to give [name] permission to discuss obesity behavioral modification therapy today.  ASSESS: Nicholas Flores has the diagnosis of obesity and his BMI today is 28.89 Nicholas Flores is in the action stage of change   ADVISE: Nicholas Flores was educated on the multiple health risks of obesity as well as the benefit of weight loss to improve his health. He was advised of the need for long term treatment and the importance of lifestyle modifications.  AGREE: Multiple dietary modification options and treatment options were discussed and  Nicholas Flores agreed to the above obesity treatment plan.  I, [place] Crane, am acting as transcriptionist for Quillian Quince, MD  I have reviewed the above documentation for accuracy and completeness, and I agree with the above. -Quillian Quince, MD

## 2017-12-30 ENCOUNTER — Other Ambulatory Visit: Payer: Self-pay | Admitting: Internal Medicine

## 2018-01-02 ENCOUNTER — Ambulatory Visit (INDEPENDENT_AMBULATORY_CARE_PROVIDER_SITE_OTHER): Payer: BC Managed Care – PPO | Admitting: Family Medicine

## 2018-01-02 ENCOUNTER — Encounter (INDEPENDENT_AMBULATORY_CARE_PROVIDER_SITE_OTHER): Payer: Self-pay | Admitting: Family Medicine

## 2018-01-02 VITALS — BP 103/66 | HR 80 | Temp 97.7°F | Ht 67.0 in | Wt 238.0 lb

## 2018-01-02 DIAGNOSIS — R7303 Prediabetes: Secondary | ICD-10-CM

## 2018-01-02 DIAGNOSIS — Z9189 Other specified personal risk factors, not elsewhere classified: Secondary | ICD-10-CM | POA: Diagnosis not present

## 2018-01-02 DIAGNOSIS — E559 Vitamin D deficiency, unspecified: Secondary | ICD-10-CM | POA: Diagnosis not present

## 2018-01-02 DIAGNOSIS — Z6837 Body mass index (BMI) 37.0-37.9, adult: Secondary | ICD-10-CM

## 2018-01-02 DIAGNOSIS — E038 Other specified hypothyroidism: Secondary | ICD-10-CM

## 2018-01-02 MED ORDER — VITAMIN D (ERGOCALCIFEROL) 1.25 MG (50000 UNIT) PO CAPS: 50000.0000 [IU] | ORAL_CAPSULE | ORAL | 0 refills | Status: DC

## 2018-01-02 NOTE — Progress Notes (Signed)
Office: [phone]  /  Fax: [phone]   HPI:   Chief Complaint: OBESITY Nicholas Flores is here to discuss his progress with his obesity treatment plan. He is on the keep a food journal with 400-600 calories and 70+ grams of protein at supper daily and follow the Category 3 plan and is following his eating plan approximately 95 % of the time. He states he is doing cardio and weight lifting for 60 minutes 3 times per week. Nicholas Flores continues to do well with weight loss on his Category 3 plan. He worked to be creative with dinner and is happier with his options.  His weight is 238 lb (108 kg) today and has had a weight loss of 4 pounds over a period of 2 to 3 weeks since his last visit. He has lost 25 lbs since starting treatment with [name].  Vitamin D Deficiency Nicholas Flores has a diagnosis of vitamin D deficiency. He is on prescription Vit D, due for labs. Last level very low, he notes fatigue is improving and denies nausea, vomiting or muscle weakness.  Hypothyroidism Nicholas Flores has a diagnosis of hypothyroidism. His TSH slightly elevated and he started Synthroid 25 mcg approximately 2 months ago. He notes fatigue improved and denies hot or cold intolerance or palpitations.  Pre-Diabetes Nicholas Flores has a diagnosis of pre-diabetes based on his elevated Hgb A1c and was informed this puts him at greater risk of developing diabetes. He is attempting to diet control, due for labs. He notes decreased polyphagia on diet prescription. He denies nausea or hypoglycemia.  At risk for diabetes Nicholas Flores is at higher than average risk for developing diabetes due to his obesity and pre-diabetes. He currently denies polyuria or polydipsia.  ALLERGIES: Allergies  Allergen Reactions  . Penicillins Rash  . Botox [Botulinum Toxin Type A]     Blurred vision    MEDICATIONS: Current Outpatient Medications on File Prior to Visit  Medication Sig Dispense Refill  . Blood Pressure Monitoring (BLOOD PRESSURE MONITOR 7) DEVI U UTD   0  . HYDROcodone-acetaminophen (NORCO) 10-325 MG tablet Take 1 tablet by mouth every 4 (four) hours as needed. 30 tablet 0  . levothyroxine (SYNTHROID, LEVOTHROID) 50 MCG tablet TAKE 1 TABLET BY MOUTH EVERY DAY 30 tablet 1  . mometasone (NASONEX) 50 MCG/ACT nasal spray USE 2 SPRAYS EACH NOSTRIL EVERY DAY AS NEEDED 17 g 11  . simvastatin (ZOCOR) 10 MG tablet TAKE 1 TABLET (10 MG TOTAL) BY MOUTH AT BEDTIME. 90 tablet 1  . SYSTANE ULTRA PF 0.4-0.3 % SOLN INSTILL ONE DROP INTO RIGHT EYE 4 TIMES A DAY  4  . tamsulosin (FLOMAX) 0.4 MG CAPS capsule Take 1 capsule (0.4 mg total) by mouth 2 (two) times daily. 180 capsule 3  . Vitamin D, Ergocalciferol, (DRISDOL) 50000 units CAPS capsule Take 1 capsule (50,000 Units total) by mouth every 7 (seven) days. 4 capsule 0   No current facility-administered medications on file prior to visit.     PAST MEDICAL HISTORY: Past Medical History:  Diagnosis Date  . BPH (benign prostatic hyperplasia)   . Migraines   . Over weight     PAST SURGICAL HISTORY: Past Surgical History:  Procedure Laterality Date  . BUNIONECTOMY    . Left knee surgery    . Left quadricep surgery    . TONSILLECTOMY      SOCIAL HISTORY: Social History   Tobacco Use  . Smoking status: Former Smoker    Types: Cigars  . Smokeless tobacco: Never Used  Substance Use Topics  . Alcohol use: Yes    Alcohol/week: 13.0 standard drinks    Types: 1 Glasses of wine, 12 Standard drinks or equivalent per week  . Drug use: No    FAMILY HISTORY: Family History  Problem Relation Age of Onset  . Stroke Mother   . Diabetes Mother   . Hypertension Mother   . Obesity Mother     ROS: Review of Systems  Constitutional: Positive for malaise/fatigue and weight loss.  Cardiovascular: Negative for palpitations.  Gastrointestinal: Negative for nausea and vomiting.  Genitourinary: Negative for frequency.  Musculoskeletal:       Negative muscle weakness  Endo/Heme/Allergies: Negative for  polydipsia.       Negative hot/cold intolerance Positive polyphagia Negative hypoglycemia    PHYSICAL EXAM: Blood pressure 103/66, pulse 80, temperature 97.7 F (36.5 C), temperature source Oral, height 5\' 7"  (1.702 m), weight 238 lb (108 kg), SpO2 98 %. Body mass index is 37.28 kg/m. Physical Exam  Constitutional: He is oriented to person, place, and time. He appears well-developed and well-nourished.  Cardiovascular: Normal rate.  Pulmonary/Chest: Effort normal.  Musculoskeletal: Normal range of motion.  Neurological: He is oriented to person, place, and time.  Skin: Skin is warm and dry.  Psychiatric: He has a normal mood and affect. His behavior is normal.  Vitals reviewed.   RECENT LABS AND TESTS: BMET    Component Value Date/Time   NA 141 07/23/2017 0943   NA 142 08/18/2011 0527   K 4.2 07/23/2017 0943   K 3.5 08/18/2011 0527   CL 105 07/23/2017 0943   CL 110 (H) 08/18/2011 0527   CO2 22 07/23/2017 0943   CO2 21 08/18/2011 0527   GLUCOSE 94 07/23/2017 0943   GLUCOSE 91 05/02/2017 0945   GLUCOSE 86 08/18/2011 0527   BUN 14 07/23/2017 0943   BUN 13 08/18/2011 0527   CREATININE 1.06 07/23/2017 0943   CREATININE 1.23 05/02/2017 0945   CALCIUM 9.1 07/23/2017 0943   CALCIUM 7.8 (L) 08/18/2011 0527   GFRNONAA 75 07/23/2017 0943   GFRNONAA 63 05/02/2017 0945   GFRAA 87 07/23/2017 0943   GFRAA 73 05/02/2017 0945   Lab Results  Component Value Date   HGBA1C 5.8 (H) 07/23/2017   Lab Results  Component Value Date   INSULIN 34.0 (H) 07/23/2017   CBC    Component Value Date/Time   WBC 3.1 (L) 07/23/2017 0943   WBC 4.8 05/02/2017 0945   RBC 4.59 07/23/2017 0943   RBC 4.54 05/02/2017 0945   HGB 13.5 07/23/2017 0943   HCT 40.3 07/23/2017 0943   PLT 196 05/02/2017 0945   PLT 126 (L) 08/18/2011 0527   MCV 88 07/23/2017 0943   MCV 91 08/18/2011 0527   MCH 29.4 07/23/2017 0943   MCH 29.5 05/02/2017 0945   MCHC 33.5 07/23/2017 0943   MCHC 33.8 05/02/2017 0945     RDW 14.8 07/23/2017 0943   RDW 14.5 08/18/2011 0527   LYMPHSABS 0.8 07/23/2017 0943   LYMPHSABS 0.8 (L) 08/18/2011 0527   MONOABS 380 04/27/2016 1120   MONOABS 0.7 08/18/2011 0527   EOSABS 0.1 07/23/2017 0943   EOSABS 0.0 08/18/2011 0527   BASOSABS 0.0 07/23/2017 0943   BASOSABS 0.0 08/18/2011 0527   Iron/TIBC/Ferritin/ %Sat No results found for: IRON, TIBC, FERRITIN, IRONPCTSAT Lipid Panel     Component Value Date/Time   CHOL 160 10/28/2017 1216   CHOL 158 07/23/2017 0943   TRIG 49 10/28/2017 1216   HDL  49 10/28/2017 1216   HDL 50 07/23/2017 0943   CHOLHDL 3.3 10/28/2017 1216   VLDL 12 10/26/2016 1216   LDLCALC 97 10/28/2017 1216   Hepatic Function Panel     Component Value Date/Time   PROT 7.2 10/28/2017 1216   PROT 7.1 07/23/2017 0943   PROT 6.2 (L) 08/18/2011 0527   ALBUMIN 4.4 07/23/2017 0943   ALBUMIN 2.9 (L) 08/18/2011 0527   AST 20 10/28/2017 1216   AST 18 08/18/2011 0527   ALT 24 10/28/2017 1216   ALT 25 08/18/2011 0527   ALKPHOS 65 07/23/2017 0943   ALKPHOS 56 08/18/2011 0527   BILITOT 0.4 10/28/2017 1216   BILITOT 0.2 07/23/2017 0943   BILITOT 0.3 08/18/2011 0527   BILIDIR 0.1 10/28/2017 1216   IBILI 0.3 10/28/2017 1216      Component Value Date/Time   TSH 5.84 (H) 10/28/2017 1216   TSH 6.250 (H) 07/23/2017 0943  Results for PRINSTON, KYNARD "BOZ" (MRN [id number]) as of 01/02/2018 08:12  Ref. Range 07/23/2017 09:43  Vitamin D, 25-Hydroxy Latest Ref Range: 30.0 - 100.0 ng/mL 9.5 (L)    ASSESSMENT AND PLAN: Vitamin D deficiency - Plan: VITAMIN D 25 Hydroxy (Vit-D Deficiency, Fractures), Vitamin D, Ergocalciferol, (DRISDOL) 50000 units CAPS capsule  Other specified hypothyroidism - Plan: T3, T4, free, TSH  Prediabetes - Plan: Comprehensive metabolic panel, Hemoglobin A1c, Insulin, random  At risk for diabetes mellitus  Class 2 severe obesity with serious comorbidity and body mass index (BMI) of 37.0 to 37.9 in adult, unspecified obesity type  (HCC)  PLAN:  Vitamin D Deficiency Nicholas Flores was informed that low vitamin D levels contributes to fatigue and are associated with obesity, breast, and colon cancer. Eder agrees to continue taking prescription Vit D @50 ,000 IU every week #4 and we will refill for 1 month. He will follow up for routine testing of vitamin D, at least 2-3 times per year. He was informed of the risk of over-replacement of vitamin D and agrees to not increase his dose unless he discusses this with [name] first. We will check labs and Nicholas Flores agrees to follow up with our clinic in 2 to 3 weeks.  Hypothyroidism Nicholas Flores was informed of the importance of good thyroid control to help with weight loss efforts. He was also informed that supertheraputic thyroid levels are dangerous and will not improve weight loss results. We will check labs and Nicholas Flores agrees to follow up with our clinic in 2 to 3 weeks.  Pre-Diabetes Nicholas Flores will continue to work on weight loss, diet, exercise, and decreasing simple carbohydrates in his diet to help decrease the risk of diabetes. We dicussed metformin including benefits and risks. He was informed that eating too many simple carbohydrates or too many calories at one sitting increases the likelihood of GI side effects. Nicholas Flores declined metformin for now and a prescription was not written today. We will check labs and Nicholas Flores agrees to follow up with our clinic in 2 to 3 weeks as directed to monitor his progress.  Diabetes risk counselling Nicholas Flores was given extended (15 minutes) diabetes prevention counseling today. He is 62 y.o. male and has risk factors for diabetes including obesity and pre-diabetes. We discussed intensive lifestyle modifications today with an emphasis on weight loss as well as increasing exercise and decreasing simple carbohydrates in his diet.  Obesity Nicholas Flores is currently in the action stage of change. As such, his goal is to continue with weight loss efforts He has agreed to keep a  food  journal with 400-600 calories and 40+ grams of protein at supper daily and follow the Category 3 plan Nicholas Flores has been instructed to work up to a goal of 150 minutes of combined cardio and strengthening exercise per week for weight loss and overall health benefits. We discussed the following Behavioral Modification Strategies today: increasing lean protein intake, decreasing simple carbohydrates  and work on meal planning and easy cooking plans   Nicholas Flores has agreed to follow up with our clinic in 2 to 3 weeks. He was informed of the importance of frequent follow up visits to maximize his success with intensive lifestyle modifications for his multiple health conditions.   OBESITY BEHAVIORAL INTERVENTION VISIT  Today's visit was # 8 out of 22.  Starting weight: 263 lbs Starting date: 07/23/17 Today's weight : 238 lbs  Today's date: 01/02/2018 Total lbs lost to date: 25    ASK: We discussed the diagnosis of obesity with Nicholas Flores today and Nicholas Flores agreed to give us permission to discuss obesity behavioral modification therapy today.  ASSESS: Nicholas Flores has the diagnosis of obesity and his BMI today is 37.27 Gautam is in the action stage of change   ADVISE: Nicholas Flores was educated on the multiple health risks of obesity as well as the benefit of weight loss to improve his health. He was advised of the need for long term treatment and the importance of lifestyle modifications.  AGREE: Multiple dietary modification options and treatment options were discussed and  Angus agreed to the above obesity treatment plan.  I, Burt KnackSharon Martin, am acting as transcriptionist for Quillian Quincearen Diogenes Whirley, MD  I have reviewed the above documentation for accuracy and completeness, and I agree with the above. -Quillian Quincearen Tyr Franca, MD

## 2018-01-03 ENCOUNTER — Other Ambulatory Visit: Payer: BC Managed Care – PPO | Admitting: Internal Medicine

## 2018-01-03 LAB — T4, FREE: FREE T4: 1.17 ng/dL (ref 0.82–1.77)

## 2018-01-03 LAB — COMPREHENSIVE METABOLIC PANEL
A/G RATIO: 1.5 (ref 1.2–2.2)
ALBUMIN: 4.5 g/dL (ref 3.6–4.8)
ALK PHOS: 63 IU/L (ref 39–117)
ALT: 28 IU/L (ref 0–44)
AST: 25 IU/L (ref 0–40)
BUN / CREAT RATIO: 20 (ref 10–24)
BUN: 26 mg/dL (ref 8–27)
Bilirubin Total: 0.3 mg/dL (ref 0.0–1.2)
CO2: 21 mmol/L (ref 20–29)
CREATININE: 1.28 mg/dL — AB (ref 0.76–1.27)
Calcium: 9.7 mg/dL (ref 8.6–10.2)
Chloride: 104 mmol/L (ref 96–106)
GFR calc Af Amer: 69 mL/min/{1.73_m2} (ref >59)
GFR calc non Af Amer: 60 mL/min/{1.73_m2} (ref >59)
GLOBULIN, TOTAL: 3 g/dL (ref 1.5–4.5)
Glucose: 88 mg/dL (ref 65–99)
POTASSIUM: 4.3 mmol/L (ref 3.5–5.2)
SODIUM: 141 mmol/L (ref 134–144)
Total Protein: 7.5 g/dL (ref 6.0–8.5)

## 2018-01-03 LAB — INSULIN, RANDOM: INSULIN: 29.5 u[IU]/mL — ABNORMAL HIGH (ref 2.6–24.9)

## 2018-01-03 LAB — VITAMIN D 25 HYDROXY (VIT D DEFICIENCY, FRACTURES): Vit D, 25-Hydroxy: 31.9 ng/mL (ref 30.0–100.0)

## 2018-01-03 LAB — T3: T3, Total: 121 ng/dL (ref 71–180)

## 2018-01-03 LAB — HEMOGLOBIN A1C
ESTIMATED AVERAGE GLUCOSE: 120 mg/dL
HEMOGLOBIN A1C: 5.8 % — AB (ref 4.8–5.6)

## 2018-01-03 LAB — TSH: TSH: 4.11 u[IU]/mL (ref 0.450–4.500)

## 2018-01-07 ENCOUNTER — Other Ambulatory Visit: Payer: Self-pay | Admitting: Internal Medicine

## 2018-01-07 DIAGNOSIS — E038 Other specified hypothyroidism: Secondary | ICD-10-CM

## 2018-01-08 ENCOUNTER — Telehealth: Payer: Self-pay

## 2018-01-08 NOTE — Telephone Encounter (Signed)
Patient called to cancel lab appt for Friday since he had a tsh checked last week at Dr. Francena HanlyBeasley's office, he would like a call back regarding any change in his medication.

## 2018-01-10 ENCOUNTER — Other Ambulatory Visit: Payer: BC Managed Care – PPO | Admitting: Internal Medicine

## 2018-01-12 ENCOUNTER — Other Ambulatory Visit: Payer: Self-pay | Admitting: Internal Medicine

## 2018-01-12 NOTE — Telephone Encounter (Signed)
Please call him. Need to increase Levothyroxine to 0.1 mg daily(14400mcg). Needs to follow up with me in 6 weeks with OV and TSH. Take levothyroxine with no other meds on an empty stomach at least one hour before a meal

## 2018-01-13 ENCOUNTER — Other Ambulatory Visit: Payer: Self-pay

## 2018-01-13 MED ORDER — LEVOTHYROXINE SODIUM 100 MCG PO TABS: 100.0000 ug | ORAL_TABLET | Freq: Every day | ORAL | 1 refills | Status: DC

## 2018-01-22 ENCOUNTER — Other Ambulatory Visit (INDEPENDENT_AMBULATORY_CARE_PROVIDER_SITE_OTHER): Payer: Self-pay | Admitting: Family Medicine

## 2018-01-22 DIAGNOSIS — E559 Vitamin D deficiency, unspecified: Secondary | ICD-10-CM

## 2018-01-27 ENCOUNTER — Ambulatory Visit: Payer: BC Managed Care – PPO | Admitting: Podiatry

## 2018-01-27 ENCOUNTER — Encounter: Payer: Self-pay | Admitting: Podiatry

## 2018-01-27 DIAGNOSIS — Q828 Other specified congenital malformations of skin: Secondary | ICD-10-CM

## 2018-01-27 DIAGNOSIS — L03031 Cellulitis of right toe: Secondary | ICD-10-CM

## 2018-01-28 ENCOUNTER — Encounter (INDEPENDENT_AMBULATORY_CARE_PROVIDER_SITE_OTHER): Payer: Self-pay

## 2018-01-28 ENCOUNTER — Ambulatory Visit (INDEPENDENT_AMBULATORY_CARE_PROVIDER_SITE_OTHER): Payer: Self-pay | Admitting: Family Medicine

## 2018-01-28 NOTE — Progress Notes (Signed)
Subjective:   Patient ID: Nicholas Flores, male   DOB: 62 y.o.   MRN: [id number]   HPI Patient presents with painful lesions on both feet and also on the right foot states that he has had some drainage after having had a paronychia and having had a pedicure and is concerned about infection   ROS      Objective:  Physical Exam  Neurovascular status intact with patient's right hallux lateral side showing moderate drainage that is localized with no proximal edema erythema drainage noted.  Patient has severe plantar keratotic lesions bilateral     Assessment:  Paronychia infection of the right hallux with thick keratotic lesion formation bilateral     Plan:  Advised on soaks and possibility removing the corner of the nail with symptoms persist but we will watch it for the near future and today debrided lesions with no iatrogenic bleeding and reappoint for routine care

## 2018-01-29 ENCOUNTER — Ambulatory Visit (INDEPENDENT_AMBULATORY_CARE_PROVIDER_SITE_OTHER): Payer: BC Managed Care – PPO | Admitting: Family Medicine

## 2018-01-29 VITALS — BP 108/64 | HR 78 | Temp 97.5°F | Ht 67.0 in | Wt 239.0 lb

## 2018-01-29 DIAGNOSIS — E559 Vitamin D deficiency, unspecified: Secondary | ICD-10-CM | POA: Diagnosis not present

## 2018-01-29 DIAGNOSIS — Z9189 Other specified personal risk factors, not elsewhere classified: Secondary | ICD-10-CM | POA: Diagnosis not present

## 2018-01-29 DIAGNOSIS — Z6837 Body mass index (BMI) 37.0-37.9, adult: Secondary | ICD-10-CM

## 2018-01-29 MED ORDER — VITAMIN D (ERGOCALCIFEROL) 1.25 MG (50000 UNIT) PO CAPS: 50000.0000 [IU] | ORAL_CAPSULE | ORAL | 0 refills | Status: DC

## 2018-01-30 NOTE — Progress Notes (Signed)
Office: [phone]  /  Fax: [phone]   HPI:   Chief Complaint: OBESITY Nicholas Flores is here to discuss his progress with his obesity treatment plan. He is on the keep a food journal with 400-600 calories and 40+ grams of protein at supper daily and follow the Category 3 plan and is following his eating plan approximately 93 % of the time. He states he is walking and lifting weights for 60 minutes 2 times per week. Nicholas Flores had some increased celebration eating and decreased exercise due to increased work hours. He was allowed to journal for dinner and does that sometimes and follow his Category 3 dinner sometimes.  His weight is 239 lb (108.4 kg) today and has gained 1 pound since his last visit. He has lost 24 lbs since starting treatment with [name].  Vitamin D Deficiency Nicholas Flores has a diagnosis of vitamin D deficiency. He is stable on prescription Vit D, but level is not yet at goal. He denies nausea, vomiting or muscle weakness.  At risk for osteopenia and osteoporosis Nicholas Flores is at higher risk of osteopenia and osteoporosis due to vitamin D deficiency.   ALLERGIES: Allergies  Allergen Reactions  . Penicillins Rash  . Botox [Botulinum Toxin Type A]     Blurred vision    MEDICATIONS: Current Outpatient Medications on File Prior to Visit  Medication Sig Dispense Refill  . Blood Pressure Monitoring (BLOOD PRESSURE MONITOR 7) DEVI U UTD  0  . HYDROcodone-acetaminophen (NORCO) 10-325 MG tablet Take 1 tablet by mouth every 4 (four) hours as needed. 30 tablet 0  . levothyroxine (SYNTHROID, LEVOTHROID) 100 MCG tablet Take 1 tablet (100 mcg total) by mouth daily. 30 tablet 1  . mometasone (NASONEX) 50 MCG/ACT nasal spray USE 2 SPRAYS EACH NOSTRIL EVERY DAY AS NEEDED 17 g 11  . simvastatin (ZOCOR) 10 MG tablet TAKE 1 TABLET (10 MG TOTAL) BY MOUTH AT BEDTIME. 90 tablet 1  . SYSTANE ULTRA PF 0.4-0.3 % SOLN INSTILL ONE DROP INTO RIGHT EYE 4 TIMES A DAY  4  . tamsulosin (FLOMAX) 0.4 MG CAPS  capsule Take 1 capsule (0.4 mg total) by mouth 2 (two) times daily. 180 capsule 3   No current facility-administered medications on file prior to visit.     PAST MEDICAL HISTORY: Past Medical History:  Diagnosis Date  . BPH (benign prostatic hyperplasia)   . Migraines   . Over weight     PAST SURGICAL HISTORY: Past Surgical History:  Procedure Laterality Date  . BUNIONECTOMY    . Left knee surgery    . Left quadricep surgery    . TONSILLECTOMY      SOCIAL HISTORY: Social History   Tobacco Use  . Smoking status: Former Smoker    Types: Cigars  . Smokeless tobacco: Never Used  Substance Use Topics  . Alcohol use: Yes    Alcohol/week: 13.0 standard drinks    Types: 1 Glasses of wine, 12 Standard drinks or equivalent per week  . Drug use: No    FAMILY HISTORY: Family History  Problem Relation Age of Onset  . Stroke Mother   . Diabetes Mother   . Hypertension Mother   . Obesity Mother     ROS: Review of Systems  Constitutional: Negative for weight loss.  Gastrointestinal: Negative for nausea and vomiting.  Musculoskeletal:       Negative muscle weakness    PHYSICAL EXAM: Blood pressure 108/64, pulse 78, temperature (!) 97.5 F (36.4 C), temperature source Oral, height 5'  7" (1.702 m), weight 239 lb (108.4 kg), SpO2 98 %. Body mass index is 37.43 kg/m. Physical Exam  Constitutional: He is oriented to person, place, and time. He appears well-developed and well-nourished.  Cardiovascular: Normal rate.  Pulmonary/Chest: Effort normal.  Musculoskeletal: Normal range of motion.  Neurological: He is oriented to person, place, and time.  Skin: Skin is warm and dry.  Psychiatric: He has a normal mood and affect. His behavior is normal.  Vitals reviewed.   RECENT LABS AND TESTS: BMET    Component Value Date/Time   NA 141 01/02/2018 0801   NA 142 08/18/2011 0527   K 4.3 01/02/2018 0801   K 3.5 08/18/2011 0527   CL 104 01/02/2018 0801   CL 110 (H)  08/18/2011 0527   CO2 21 01/02/2018 0801   CO2 21 08/18/2011 0527   GLUCOSE 88 01/02/2018 0801   GLUCOSE 91 05/02/2017 0945   GLUCOSE 86 08/18/2011 0527   BUN 26 01/02/2018 0801   BUN 13 08/18/2011 0527   CREATININE 1.28 (H) 01/02/2018 0801   CREATININE 1.23 05/02/2017 0945   CALCIUM 9.7 01/02/2018 0801   CALCIUM 7.8 (L) 08/18/2011 0527   GFRNONAA 60 01/02/2018 0801   GFRNONAA 63 05/02/2017 0945   GFRAA 69 01/02/2018 0801   GFRAA 73 05/02/2017 0945   Lab Results  Component Value Date   HGBA1C 5.8 (H) 01/02/2018   HGBA1C 5.8 (H) 07/23/2017   Lab Results  Component Value Date   INSULIN 29.5 (H) 01/02/2018   INSULIN 34.0 (H) 07/23/2017   CBC    Component Value Date/Time   WBC 3.1 (L) 07/23/2017 0943   WBC 4.8 05/02/2017 0945   RBC 4.59 07/23/2017 0943   RBC 4.54 05/02/2017 0945   HGB 13.5 07/23/2017 0943   HCT 40.3 07/23/2017 0943   PLT 196 05/02/2017 0945   PLT 126 (L) 08/18/2011 0527   MCV 88 07/23/2017 0943   MCV 91 08/18/2011 0527   MCH 29.4 07/23/2017 0943   MCH 29.5 05/02/2017 0945   MCHC 33.5 07/23/2017 0943   MCHC 33.8 05/02/2017 0945   RDW 14.8 07/23/2017 0943   RDW 14.5 08/18/2011 0527   LYMPHSABS 0.8 07/23/2017 0943   LYMPHSABS 0.8 (L) 08/18/2011 0527   MONOABS 380 04/27/2016 1120   MONOABS 0.7 08/18/2011 0527   EOSABS 0.1 07/23/2017 0943   EOSABS 0.0 08/18/2011 0527   BASOSABS 0.0 07/23/2017 0943   BASOSABS 0.0 08/18/2011 0527   Iron/TIBC/Ferritin/ %Sat No results found for: IRON, TIBC, FERRITIN, IRONPCTSAT Lipid Panel     Component Value Date/Time   CHOL 160 10/28/2017 1216   CHOL 158 07/23/2017 0943   TRIG 49 10/28/2017 1216   HDL 49 10/28/2017 1216   HDL 50 07/23/2017 0943   CHOLHDL 3.3 10/28/2017 1216   VLDL 12 10/26/2016 1216   LDLCALC 97 10/28/2017 1216   Hepatic Function Panel     Component Value Date/Time   PROT 7.5 01/02/2018 0801   PROT 6.2 (L) 08/18/2011 0527   ALBUMIN 4.5 01/02/2018 0801   ALBUMIN 2.9 (L) 08/18/2011 0527     AST 25 01/02/2018 0801   AST 18 08/18/2011 0527   ALT 28 01/02/2018 0801   ALT 25 08/18/2011 0527   ALKPHOS 63 01/02/2018 0801   ALKPHOS 56 08/18/2011 0527   BILITOT 0.3 01/02/2018 0801   BILITOT 0.3 08/18/2011 0527   BILIDIR 0.1 10/28/2017 1216   IBILI 0.3 10/28/2017 1216      Component Value Date/Time   TSH  4.110 01/02/2018 0801   TSH 5.84 (H) 10/28/2017 1216   TSH 6.250 (H) 07/23/2017 0943  Results for Marlou PorchBOSWELL, Maki W "BOZ" (MRN [id number]) as of 01/30/2018 12:13  Ref. Range 01/02/2018 08:01  Vitamin D, 25-Hydroxy Latest Ref Range: 30.0 - 100.0 ng/mL 31.9    ASSESSMENT AND PLAN: Vitamin D deficiency - Plan: Vitamin D, Ergocalciferol, (DRISDOL) 50000 units CAPS capsule  At risk for osteoporosis  Class 2 severe obesity with serious comorbidity and body mass index (BMI) of 37.0 to 37.9 in adult, unspecified obesity type (HCC)  PLAN:  Vitamin D Deficiency Nicholas Flores was informed that low vitamin D levels contributes to fatigue and are associated with obesity, breast, and colon cancer. Nicholas Flores agrees to continue taking prescription Vit D @50 ,000 IU every week #4 and we will refill for 1 month. He will follow up for routine testing of vitamin D, at least 2-3 times per year. He was informed of the risk of over-replacement of vitamin D and agrees to not increase his dose unless he discusses this with us first. Nicholas Flores agrees to follow up with our clinic in 3 to 4 weeks.  At risk for osteopenia and osteoporosis Nicholas Flores was given extended  (15 minutes) osteoporosis prevention counseling today. Nicholas Flores is at risk for osteopenia and osteoporsis due to his vitamin D deficiency. He was encouraged to take his vitamin D and follow his higher calcium diet and increase strengthening exercise to help strengthen his bones and decrease his risk of osteopenia and osteoporosis.  Obesity Nicholas Flores is currently in the action stage of change. As such, his goal is to continue with weight loss efforts He has  agreed to keep a food journal with 400-600 calories and 40+ grams of protein at supper daily and follow the Category 3 plan Nicholas Flores has been instructed to work up to a goal of 150 minutes of combined cardio and strengthening exercise per week for weight loss and overall health benefits. We discussed the following Behavioral Modification Strategies today: increasing lean protein intake, decreasing simple carbohydrates  and work on meal planning and easy cooking plans   Nicholas Flores has agreed to follow up with our clinic in 3 to 4 weeks. He was informed of the importance of frequent follow up visits to maximize his success with intensive lifestyle modifications for his multiple health conditions.   OBESITY BEHAVIORAL INTERVENTION VISIT  Today's visit was # 9   Starting weight: 263 lbs Starting date: 07/23/17 Today's weight : 239 lbs  Today's date: 01/29/2018 Total lbs lost to date: 24    ASK: We discussed the diagnosis of obesity with Nicholas Flores today and Kay agreed to give us permission to discuss obesity behavioral modification therapy today.  ASSESS: Nicholas Flores has the diagnosis of obesity and his BMI today is 37.42 Nicholas Flores is in the action stage of change   ADVISE: Nicholas Flores was educated on the multiple health risks of obesity as well as the benefit of weight loss to improve his health. He was advised of the need for long term treatment and the importance of lifestyle modifications to improve his current health and to decrease his risk of future health problems.  AGREE: Multiple dietary modification options and treatment options were discussed and  Nicholas Flores agreed to follow the recommendations documented in the above note.  ARRANGE: Nicholas Flores was educated on the importance of frequent visits to treat obesity as outlined per CMS and USPSTF guidelines and agreed to schedule his next follow up appointment today.  Trude McburneyI, Sharon Martin, am  acting as transcriptionist for Quillian Quince, MD  I have  reviewed the above documentation for accuracy and completeness, and I agree with the above. -Quillian Quince, MD

## 2018-02-04 ENCOUNTER — Other Ambulatory Visit: Payer: Self-pay | Admitting: Internal Medicine

## 2018-02-21 ENCOUNTER — Encounter: Payer: Self-pay | Admitting: Internal Medicine

## 2018-02-21 LAB — HM DIABETES EYE EXAM

## 2018-02-24 ENCOUNTER — Other Ambulatory Visit: Payer: BC Managed Care – PPO | Admitting: Internal Medicine

## 2018-02-24 DIAGNOSIS — E038 Other specified hypothyroidism: Secondary | ICD-10-CM

## 2018-02-25 ENCOUNTER — Ambulatory Visit (INDEPENDENT_AMBULATORY_CARE_PROVIDER_SITE_OTHER): Payer: BC Managed Care – PPO | Admitting: Family Medicine

## 2018-02-25 VITALS — BP 121/76 | HR 73 | Temp 98.2°F | Ht 67.0 in | Wt 240.0 lb

## 2018-02-25 DIAGNOSIS — Z6837 Body mass index (BMI) 37.0-37.9, adult: Secondary | ICD-10-CM | POA: Diagnosis not present

## 2018-02-25 DIAGNOSIS — E559 Vitamin D deficiency, unspecified: Secondary | ICD-10-CM | POA: Diagnosis not present

## 2018-02-25 DIAGNOSIS — R7303 Prediabetes: Secondary | ICD-10-CM

## 2018-02-25 LAB — TSH: TSH: 1.68 mIU/L (ref 0.40–4.50)

## 2018-02-26 NOTE — Progress Notes (Signed)
Office: [phone]  /  Fax: [phone]   HPI:   Chief Complaint: OBESITY Nicholas Flores is here to discuss his progress with his obesity treatment plan. He is on the Category 3 plan and is following his eating plan approximately 90 to 94 % of the time. He states he is walking 2 miles 4 days a week. Nicholas Flores is currently struggling with sticking to the plan on weekends, but tries to make healthier choices. He has become more consistent with exercise. He denies hunger, but reports some sweet cravings.  His weight is 240 lb (108.9 kg) today and has gained 1 pound in 3 weeks. since his last visit. He has lost 23 lbs since starting treatment with [name].  Pre-Diabetes Nicholas Flores has a diagnosis of pre-diabetes based on his elevated Hgb A1c and was informed this puts him at greater risk of developing diabetes. He is stable and is not taking metformin currently and continues to work on diet and exercise to decrease risk of diabetes.   Vitamin D deficiency Nicholas Flores has a diagnosis of vitamin D deficiency. His vitamin D levels have increase on the higher dose of vitamin D and he is stable. He is currently taking vit D.  ALLERGIES: Allergies  Allergen Reactions  . Penicillins Rash  . Botox [Botulinum Toxin Type A]     Blurred vision    MEDICATIONS: Current Outpatient Medications on File Prior to Visit  Medication Sig Dispense Refill  . Blood Pressure Monitoring (BLOOD PRESSURE MONITOR 7) DEVI U UTD  0  . HYDROcodone-acetaminophen (NORCO) 10-325 MG tablet Take 1 tablet by mouth every 4 (four) hours as needed. 30 tablet 0  . levothyroxine (SYNTHROID, LEVOTHROID) 100 MCG tablet TAKE 1 TABLET BY MOUTH EVERY DAY 30 tablet 1  . mometasone (NASONEX) 50 MCG/ACT nasal spray USE 2 SPRAYS EACH NOSTRIL EVERY DAY AS NEEDED 17 g 11  . simvastatin (ZOCOR) 10 MG tablet TAKE 1 TABLET (10 MG TOTAL) BY MOUTH AT BEDTIME. 90 tablet 1  . SYSTANE ULTRA PF 0.4-0.3 % SOLN INSTILL ONE DROP INTO RIGHT EYE 4 TIMES A DAY  4  .  tamsulosin (FLOMAX) 0.4 MG CAPS capsule Take 1 capsule (0.4 mg total) by mouth 2 (two) times daily. 180 capsule 3  . Vitamin D, Ergocalciferol, (DRISDOL) 50000 units CAPS capsule Take 1 capsule (50,000 Units total) by mouth every 7 (seven) days. 4 capsule 0   No current facility-administered medications on file prior to visit.     PAST MEDICAL HISTORY: Past Medical History:  Diagnosis Date  . BPH (benign prostatic hyperplasia)   . Migraines   . Over weight     PAST SURGICAL HISTORY: Past Surgical History:  Procedure Laterality Date  . BUNIONECTOMY    . Left knee surgery    . Left quadricep surgery    . TONSILLECTOMY      SOCIAL HISTORY: Social History   Tobacco Use  . Smoking status: Former Smoker    Types: Cigars  . Smokeless tobacco: Never Used  Substance Use Topics  . Alcohol use: Yes    Alcohol/week: 13.0 standard drinks    Types: 1 Glasses of wine, 12 Standard drinks or equivalent per week  . Drug use: No    FAMILY HISTORY: Family History  Problem Relation Age of Onset  . Stroke Mother   . Diabetes Mother   . Hypertension Mother   . Obesity Mother     ROS: Review of Systems  Constitutional: Negative for weight loss.    PHYSICAL  EXAM: Blood pressure 121/76, pulse 73, temperature 98.2 F (36.8 C), temperature source Oral, height 5\' 7"  (1.702 m), weight 240 lb (108.9 kg), SpO2 98 %. Body mass index is 37.59 kg/m. Physical Exam  Constitutional: He is oriented to person, place, and time. He appears well-developed and well-nourished.  Cardiovascular: Normal rate.  Pulmonary/Chest: Effort normal.  Musculoskeletal: Normal range of motion.  Neurological: He is oriented to person, place, and time.  Skin: Skin is warm and dry.  Psychiatric: He has a normal mood and affect. His behavior is normal.  Vitals reviewed.   RECENT LABS AND TESTS: BMET    Component Value Date/Time   NA 141 01/02/2018 0801   NA 142 08/18/2011 0527   K 4.3 01/02/2018 0801   K  3.5 08/18/2011 0527   CL 104 01/02/2018 0801   CL 110 (H) 08/18/2011 0527   CO2 21 01/02/2018 0801   CO2 21 08/18/2011 0527   GLUCOSE 88 01/02/2018 0801   GLUCOSE 91 05/02/2017 0945   GLUCOSE 86 08/18/2011 0527   BUN 26 01/02/2018 0801   BUN 13 08/18/2011 0527   CREATININE 1.28 (H) 01/02/2018 0801   CREATININE 1.23 05/02/2017 0945   CALCIUM 9.7 01/02/2018 0801   CALCIUM 7.8 (L) 08/18/2011 0527   GFRNONAA 60 01/02/2018 0801   GFRNONAA 63 05/02/2017 0945   GFRAA 69 01/02/2018 0801   GFRAA 73 05/02/2017 0945   Lab Results  Component Value Date   HGBA1C 5.8 (H) 01/02/2018   HGBA1C 5.8 (H) 07/23/2017   Lab Results  Component Value Date   INSULIN 29.5 (H) 01/02/2018   INSULIN 34.0 (H) 07/23/2017   CBC    Component Value Date/Time   WBC 3.1 (L) 07/23/2017 0943   WBC 4.8 05/02/2017 0945   RBC 4.59 07/23/2017 0943   RBC 4.54 05/02/2017 0945   HGB 13.5 07/23/2017 0943   HCT 40.3 07/23/2017 0943   PLT 196 05/02/2017 0945   PLT 126 (L) 08/18/2011 0527   MCV 88 07/23/2017 0943   MCV 91 08/18/2011 0527   MCH 29.4 07/23/2017 0943   MCH 29.5 05/02/2017 0945   MCHC 33.5 07/23/2017 0943   MCHC 33.8 05/02/2017 0945   RDW 14.8 07/23/2017 0943   RDW 14.5 08/18/2011 0527   LYMPHSABS 0.8 07/23/2017 0943   LYMPHSABS 0.8 (L) 08/18/2011 0527   MONOABS 380 04/27/2016 1120   MONOABS 0.7 08/18/2011 0527   EOSABS 0.1 07/23/2017 0943   EOSABS 0.0 08/18/2011 0527   BASOSABS 0.0 07/23/2017 0943   BASOSABS 0.0 08/18/2011 0527   Iron/TIBC/Ferritin/ %Sat No results found for: IRON, TIBC, FERRITIN, IRONPCTSAT Lipid Panel     Component Value Date/Time   CHOL 160 10/28/2017 1216   CHOL 158 07/23/2017 0943   TRIG 49 10/28/2017 1216   HDL 49 10/28/2017 1216   HDL 50 07/23/2017 0943   CHOLHDL 3.3 10/28/2017 1216   VLDL 12 10/26/2016 1216   LDLCALC 97 10/28/2017 1216   Hepatic Function Panel     Component Value Date/Time   PROT 7.5 01/02/2018 0801   PROT 6.2 (L) 08/18/2011 0527    ALBUMIN 4.5 01/02/2018 0801   ALBUMIN 2.9 (L) 08/18/2011 0527   AST 25 01/02/2018 0801   AST 18 08/18/2011 0527   ALT 28 01/02/2018 0801   ALT 25 08/18/2011 0527   ALKPHOS 63 01/02/2018 0801   ALKPHOS 56 08/18/2011 0527   BILITOT 0.3 01/02/2018 0801   BILITOT 0.3 08/18/2011 0527   BILIDIR 0.1 10/28/2017 1216  IBILI 0.3 10/28/2017 1216      Component Value Date/Time   TSH 1.68 02/24/2018 1005   TSH 4.110 01/02/2018 0801   TSH 5.84 (H) 10/28/2017 1216   Results for Nicholas, Flores" (MRN [id number]) as of 02/26/2018 08:19  Ref. Range 01/02/2018 08:01  Vitamin D, 25-Hydroxy Latest Ref Range: 30.0 - 100.0 ng/mL 31.9    ASSESSMENT AND PLAN: Prediabetes  Vitamin D deficiency  Class 2 severe obesity with serious comorbidity and body mass index (BMI) of 37.0 to 37.9 in adult, unspecified obesity type Nicholas Flores)  PLAN:  Pre-Diabetes Nicholas Flores will continue to work on weight loss, exercise, and decreasing simple carbohydrates in his diet to help decrease the risk of diabetes. He was informed that eating too many simple carbohydrates or too many calories at one sitting increases the likelihood of GI side effects. He agrees to continue his diet and decrease simple carbs. He was advised not to purchase sweets. Nicholas Flores agreed to follow up with [name] as directed to monitor his progress in 3 weeks.  Vitamin D Deficiency Nicholas Flores was informed that low vitamin D levels contributes to fatigue and are associated with obesity, breast, and colon cancer. He agrees to continue to take prescription Vit D @50 ,000 IU every week with no refills needed and will follow up for routine testing of vitamin D, at least 2-3 times per year. He was informed of the risk of over-replacement of vitamin D and agrees to not increase his dose unless he discusses this with [name] first. He will follow up as directed.  I spent > than 50% of the 15 minute visit on counseling as documented in the note.  Obesity Nicholas Flores is currently in  the action stage of change. As such, his goal is to continue with weight loss efforts. He has agreed to follow the Category 3 plan and keep a food journal of 400 to 600 calories and 40+ grams of protein for supper. Nicholas Flores has been instructed to do a combo of weights and treadmill 5 days a week or work up to a goal of 150 minutes of combined cardio and strengthening exercise per week for weight loss and overall health benefits. We discussed the following Behavioral Modification Strategies today: increasing lean protein intake, decreasing simple carbohydrates, travel eating strategies, celebration eating strategies, and avoiding temptations.   Nicholas Flores has agreed to follow up with our clinic in 3 weeks. He was informed of the importance of frequent follow up visits to maximize his success with intensive lifestyle modifications for his multiple health conditions.   OBESITY BEHAVIORAL INTERVENTION VISIT  Today's visit was # 10   Starting weight: 263 lbs  Starting date: 07/23/17 Today's weight : Weight: 240 lb (108.9 kg)  Today's date: 02/25/2018 Total lbs lost to date: 12  ASK: We discussed the diagnosis of obesity with Nicholas Flores today and Nicholas Flores agreed to give [name] permission to discuss obesity behavioral modification therapy today.  ASSESS: Nicholas Flores has the diagnosis of obesity and his BMI today is 37.58. Nicholas Flores is in the action stage of change.   ADVISE: Jordany was educated on the multiple health risks of obesity as well as the benefit of weight loss to improve his health. He was advised of the need for long term treatment and the importance of lifestyle modifications to improve his current health and to decrease his risk of future health problems.  AGREE: Multiple dietary modification options and treatment options were discussed and Nicholas Flores agreed to follow the recommendations documented in  the above note.  ARRANGE: Nicholas Flores was educated on the importance of frequent visits to treat  obesity as outlined per CMS and USPSTF guidelines and agreed to schedule his next follow up appointment today.  I, Kirke Corin, am acting as transcriptionist for Wilder Glade, MD  I have reviewed the above documentation for accuracy and completeness, and I agree with the above. -Quillian Quince, MD

## 2018-02-28 ENCOUNTER — Ambulatory Visit: Payer: BC Managed Care – PPO | Admitting: Internal Medicine

## 2018-03-03 ENCOUNTER — Other Ambulatory Visit: Payer: Self-pay | Admitting: Internal Medicine

## 2018-03-07 ENCOUNTER — Ambulatory Visit: Payer: BC Managed Care – PPO | Admitting: Internal Medicine

## 2018-03-14 ENCOUNTER — Other Ambulatory Visit (INDEPENDENT_AMBULATORY_CARE_PROVIDER_SITE_OTHER): Payer: Self-pay | Admitting: Family Medicine

## 2018-03-14 DIAGNOSIS — E559 Vitamin D deficiency, unspecified: Secondary | ICD-10-CM

## 2018-03-17 ENCOUNTER — Ambulatory Visit (INDEPENDENT_AMBULATORY_CARE_PROVIDER_SITE_OTHER): Payer: BC Managed Care – PPO | Admitting: Family Medicine

## 2018-03-21 ENCOUNTER — Encounter: Payer: Self-pay | Admitting: Internal Medicine

## 2018-03-21 ENCOUNTER — Ambulatory Visit (INDEPENDENT_AMBULATORY_CARE_PROVIDER_SITE_OTHER): Payer: BC Managed Care – PPO | Admitting: Internal Medicine

## 2018-03-21 VITALS — BP 100/80 | HR 78 | Ht 67.0 in | Wt 236.0 lb

## 2018-03-21 DIAGNOSIS — E039 Hypothyroidism, unspecified: Secondary | ICD-10-CM | POA: Diagnosis not present

## 2018-03-21 DIAGNOSIS — R7302 Impaired glucose tolerance (oral): Secondary | ICD-10-CM | POA: Diagnosis not present

## 2018-03-21 DIAGNOSIS — E785 Hyperlipidemia, unspecified: Secondary | ICD-10-CM

## 2018-03-21 NOTE — Progress Notes (Signed)
   Subjective:    Patient ID: Nicholas Flores, male    DOB: 25-Aug-1955, 62 y.o.   MRN: [id number]  HPI Had flu shot at work in October. Is retiring in 22 days. Continues to see Dr. Dalbert Garnet for weight loss management and doing well.  Here for follow-up on hypothyroidism.  In June TSH was 5.84 and in October TSH was 1.68.  In August had impaired glucose tolerance with hemoglobin A1c 5.8%    Review of Systems weighed 259 pounds in 2016. Weighed 250 pounds in 2017.     Objective:   Physical Exam  BP 100/80. Pulse 78, Weight 236 pounds.  No thyromegaly.      Assessment & Plan:  Hypothyroidism  Hyperlipidemia  Impaired glucose tolerance  Plan: Continue counseling with Dr. Dalbert Garnet regarding weight loss.  Continue statin medication.  Continue same dose of thyroid replacement.  Physical exam due December.

## 2018-03-21 NOTE — Progress Notes (Deleted)
   Subjective:    Patient ID: Nicholas Flores, male    DOB: [birth year], 62 y.o.   MRN: [id number]  HPI    Review of Systems     Objective:   Physical Exam        Assessment & Plan:

## 2018-03-24 ENCOUNTER — Ambulatory Visit (INDEPENDENT_AMBULATORY_CARE_PROVIDER_SITE_OTHER): Payer: BC Managed Care – PPO | Admitting: Family Medicine

## 2018-03-24 VITALS — BP 121/75 | HR 73 | Temp 98.1°F | Ht 67.0 in | Wt 234.0 lb

## 2018-03-24 DIAGNOSIS — Z9189 Other specified personal risk factors, not elsewhere classified: Secondary | ICD-10-CM | POA: Diagnosis not present

## 2018-03-24 DIAGNOSIS — Z6836 Body mass index (BMI) 36.0-36.9, adult: Secondary | ICD-10-CM

## 2018-03-24 DIAGNOSIS — E559 Vitamin D deficiency, unspecified: Secondary | ICD-10-CM

## 2018-03-24 MED ORDER — VITAMIN D (ERGOCALCIFEROL) 1.25 MG (50000 UNIT) PO CAPS: 50000.0000 [IU] | ORAL_CAPSULE | ORAL | 0 refills | Status: DC

## 2018-03-25 ENCOUNTER — Encounter (INDEPENDENT_AMBULATORY_CARE_PROVIDER_SITE_OTHER): Payer: Self-pay | Admitting: Family Medicine

## 2018-03-25 NOTE — Progress Notes (Signed)
Office: [phone]  /  Fax: [phone]   HPI:   Chief Complaint: OBESITY Nicholas Flores is here to discuss his progress with his obesity treatment plan. He is on the keep a food journal with 400-600 calories and 40+ grams of protein at supper daily and follow the Category 3 plan and is following his eating plan approximately 93 % of the time. He states he is walking 2 miles and lifting weights for 45-60 minutes 5 times per week. Nicholas Flores continues to do well with weight loss on his Category 3 plan, but he is getting bored especially with dinner and would like more options.  His weight is 234 lb (106.1 kg) today and has had a weight loss of 6 pounds over a period of 4 weeks since his last visit. He has lost 29 lbs since starting treatment with Nicholas Flores.  Vitamin D Deficiency Nicholas Flores has a diagnosis of vitamin D deficiency. He is stable on prescription Vit D. He notes fatigue is improving and denies nausea, vomiting or muscle weakness.  At risk for cardiovascular disease Nicholas Flores is at a higher than average risk for cardiovascular disease due to obesity. He currently denies any chest pain.  ALLERGIES: Allergies  Allergen Reactions  . Penicillins Rash  . Botox [Botulinum Toxin Type A]     Blurred vision    MEDICATIONS: Current Outpatient Medications on File Prior to Visit  Medication Sig Dispense Refill  . Blood Pressure Monitoring (BLOOD PRESSURE MONITOR 7) DEVI U UTD  0  . HYDROcodone-acetaminophen (NORCO) 10-325 MG tablet Take 1 tablet by mouth every 4 (four) hours as needed. 30 tablet 0  . levothyroxine (SYNTHROID, LEVOTHROID) 100 MCG tablet TAKE 1 TABLET BY MOUTH EVERY DAY 90 tablet 0  . mometasone (NASONEX) 50 MCG/ACT nasal spray USE 2 SPRAYS EACH NOSTRIL EVERY DAY AS NEEDED 17 g 11  . simvastatin (ZOCOR) 10 MG tablet TAKE 1 TABLET (10 MG TOTAL) BY MOUTH AT BEDTIME. 90 tablet 1  . SYSTANE ULTRA PF 0.4-0.3 % SOLN INSTILL ONE DROP INTO RIGHT EYE 4 TIMES A DAY  4  . tamsulosin (FLOMAX) 0.4 MG  CAPS capsule Take 1 capsule (0.4 mg total) by mouth 2 (two) times daily. 180 capsule 3   No current facility-administered medications on file prior to visit.     PAST MEDICAL HISTORY: Past Medical History:  Diagnosis Date  . BPH (benign prostatic hyperplasia)   . Migraines   . Over weight     PAST SURGICAL HISTORY: Past Surgical History:  Procedure Laterality Date  . BUNIONECTOMY    . Left knee surgery    . Left quadricep surgery    . TONSILLECTOMY      SOCIAL HISTORY: Social History   Tobacco Use  . Smoking status: Former Smoker    Types: Cigars  . Smokeless tobacco: Never Used  Substance Use Topics  . Alcohol use: Yes    Alcohol/week: 13.0 standard drinks    Types: 1 Glasses of wine, 12 Standard drinks or equivalent per week  . Drug use: No    FAMILY HISTORY: Family History  Problem Relation Age of Onset  . Stroke Mother   . Diabetes Mother   . Hypertension Mother   . Obesity Mother     ROS: Review of Systems  Constitutional: Positive for malaise/fatigue and weight loss.  Cardiovascular: Negative for chest pain.  Gastrointestinal: Negative for nausea and vomiting.  Musculoskeletal:       Negative muscle weakness    PHYSICAL EXAM: Blood pressure  121/75, pulse 73, temperature 98.1 F (36.7 C), temperature source Oral, height 5\' 7"  (1.702 m), weight 234 lb (106.1 kg), SpO2 98 %. Body mass index is 36.65 kg/m. Physical Exam  Constitutional: He is oriented to person, place, and time. He appears well-developed and well-nourished.  Cardiovascular: Normal rate.  Pulmonary/Chest: Effort normal.  Musculoskeletal: Normal range of motion.  Neurological: He is oriented to person, place, and time.  Skin: Skin is warm and dry.  Psychiatric: He has a normal mood and affect. His behavior is normal.  Vitals reviewed.   RECENT LABS AND TESTS: BMET    Component Value Date/Time   NA 141 01/02/2018 0801   NA 142 08/18/2011 0527   K 4.3 01/02/2018 0801   K 3.5  08/18/2011 0527   CL 104 01/02/2018 0801   CL 110 (H) 08/18/2011 0527   CO2 21 01/02/2018 0801   CO2 21 08/18/2011 0527   GLUCOSE 88 01/02/2018 0801   GLUCOSE 91 05/02/2017 0945   GLUCOSE 86 08/18/2011 0527   BUN 26 01/02/2018 0801   BUN 13 08/18/2011 0527   CREATININE 1.28 (H) 01/02/2018 0801   CREATININE 1.23 05/02/2017 0945   CALCIUM 9.7 01/02/2018 0801   CALCIUM 7.8 (L) 08/18/2011 0527   GFRNONAA 60 01/02/2018 0801   GFRNONAA 63 05/02/2017 0945   GFRAA 69 01/02/2018 0801   GFRAA 73 05/02/2017 0945   Lab Results  Component Value Date   HGBA1C 5.8 (H) 01/02/2018   HGBA1C 5.8 (H) 07/23/2017   Lab Results  Component Value Date   INSULIN 29.5 (H) 01/02/2018   INSULIN 34.0 (H) 07/23/2017   CBC    Component Value Date/Time   WBC 3.1 (L) 07/23/2017 0943   WBC 4.8 05/02/2017 0945   RBC 4.59 07/23/2017 0943   RBC 4.54 05/02/2017 0945   HGB 13.5 07/23/2017 0943   HCT 40.3 07/23/2017 0943   PLT 196 05/02/2017 0945   PLT 126 (L) 08/18/2011 0527   MCV 88 07/23/2017 0943   MCV 91 08/18/2011 0527   MCH 29.4 07/23/2017 0943   MCH 29.5 05/02/2017 0945   MCHC 33.5 07/23/2017 0943   MCHC 33.8 05/02/2017 0945   RDW 14.8 07/23/2017 0943   RDW 14.5 08/18/2011 0527   LYMPHSABS 0.8 07/23/2017 0943   LYMPHSABS 0.8 (L) 08/18/2011 0527   MONOABS 380 04/27/2016 1120   MONOABS 0.7 08/18/2011 0527   EOSABS 0.1 07/23/2017 0943   EOSABS 0.0 08/18/2011 0527   BASOSABS 0.0 07/23/2017 0943   BASOSABS 0.0 08/18/2011 0527   Iron/TIBC/Ferritin/ %Sat No results found for: IRON, TIBC, FERRITIN, IRONPCTSAT Lipid Panel     Component Value Date/Time   CHOL 160 10/28/2017 1216   CHOL 158 07/23/2017 0943   TRIG 49 10/28/2017 1216   HDL 49 10/28/2017 1216   HDL 50 07/23/2017 0943   CHOLHDL 3.3 10/28/2017 1216   VLDL 12 10/26/2016 1216   LDLCALC 97 10/28/2017 1216   Hepatic Function Panel     Component Value Date/Time   PROT 7.5 01/02/2018 0801   PROT 6.2 (L) 08/18/2011 0527   ALBUMIN  4.5 01/02/2018 0801   ALBUMIN 2.9 (L) 08/18/2011 0527   AST 25 01/02/2018 0801   AST 18 08/18/2011 0527   ALT 28 01/02/2018 0801   ALT 25 08/18/2011 0527   ALKPHOS 63 01/02/2018 0801   ALKPHOS 56 08/18/2011 0527   BILITOT 0.3 01/02/2018 0801   BILITOT 0.3 08/18/2011 0527   BILIDIR 0.1 10/28/2017 1216   IBILI 0.3 10/28/2017  1216      Component Value Date/Time   TSH 1.68 02/24/2018 1005   TSH 4.110 01/02/2018 0801   TSH 5.84 (H) 10/28/2017 1216  Results for STRYDER, POITRA" (MRN [id number]) as of 03/25/2018 10:36  Ref. Range 01/02/2018 08:01  Vitamin D, 25-Hydroxy Latest Ref Range: 30.0 - 100.0 ng/mL 31.9    ASSESSMENT AND PLAN: Vitamin D deficiency - Plan: Vitamin D, Ergocalciferol, (DRISDOL) 50000 units CAPS capsule  At risk for heart disease  Class 2 severe obesity with serious comorbidity and body mass index (BMI) of 36.0 to 36.9 in adult, unspecified obesity type (HCC)  PLAN:  Vitamin D Deficiency Nicholas Flores was informed that low vitamin D levels contributes to fatigue and are associated with obesity, breast, and colon cancer. Nicholas Flores agrees to continue taking prescription Vit D @50 ,000 IU every week #4 and we will refill for 1 month. He will follow up for routine testing of vitamin D, at least 2-3 times per year. He was informed of the risk of over-replacement of vitamin D and agrees to not increase his dose unless he discusses this with Nicholas Flores first. We will recheck labs in 1 month. Nicholas Flores agrees to follow up with our clinic in 2 weeks.  Cardiovascular risk counselling Nicholas Flores was given extended (15 minutes) coronary artery disease prevention counseling today. He is 62 y.o. male and has risk factors for heart disease including obesity. We discussed intensive lifestyle modifications today with an emphasis on specific weight loss instructions and strategies. Pt was also informed of the importance of increasing exercise and decreasing saturated fats to help prevent heart  disease.  Obesity Nicholas Flores is currently in the action stage of change. As such, his goal is to continue with weight loss efforts He has agreed to follow the Category 3 plan Nicholas Flores has been instructed to work up to a goal of 150 minutes of combined cardio and strengthening exercise per week for weight loss and overall health benefits. We discussed the following Behavioral Modification Strategies today: increasing lean protein intake, decreasing simple carbohydrates  and work on meal planning and easy cooking plans   Nicholas Flores has agreed to follow up with our clinic in 2 weeks. He was informed of the importance of frequent follow up visits to maximize his success with intensive lifestyle modifications for his multiple health conditions.   OBESITY BEHAVIORAL INTERVENTION VISIT  Today's visit was # 11   Starting weight: 263 lbs Starting date: 07/23/17 Today's weight : 234 lbs  Today's date: 03/24/2018 Total lbs lost to date: 50    ASK: We discussed the diagnosis of obesity with Romero Belling today and Kele agreed to give Nicholas Flores permission to discuss obesity behavioral modification therapy today.  ASSESS: Evens has the diagnosis of obesity and his BMI today is 36.64 Norberto is in the action stage of change   ADVISE: Soua was educated on the multiple health risks of obesity as well as the benefit of weight loss to improve his health. He was advised of the need for long term treatment and the importance of lifestyle modifications to improve his current health and to decrease his risk of future health problems.  AGREE: Multiple dietary modification options and treatment options were discussed and  Elaine agreed to follow the recommendations documented in the above note.  ARRANGE: Morrie was educated on the importance of frequent visits to treat obesity as outlined per CMS and USPSTF guidelines and agreed to schedule his next follow up appointment today.  Trude Mcburney,  am acting as  transcriptionist for Quillian Quince, MD  I have reviewed the above documentation for accuracy and completeness, and I agree with the above. -Quillian Quince, MD

## 2018-04-09 ENCOUNTER — Ambulatory Visit (INDEPENDENT_AMBULATORY_CARE_PROVIDER_SITE_OTHER): Payer: BC Managed Care – PPO | Admitting: Family Medicine

## 2018-04-18 NOTE — Patient Instructions (Addendum)
Continue treatment and counseling with Dr. Dalbert GarnetBeasley.  Continue same dose of thyroid replacement.  Physical exam due in December.

## 2018-04-25 ENCOUNTER — Other Ambulatory Visit (INDEPENDENT_AMBULATORY_CARE_PROVIDER_SITE_OTHER): Payer: Self-pay | Admitting: Family Medicine

## 2018-04-25 DIAGNOSIS — E559 Vitamin D deficiency, unspecified: Secondary | ICD-10-CM

## 2018-04-29 ENCOUNTER — Ambulatory Visit (INDEPENDENT_AMBULATORY_CARE_PROVIDER_SITE_OTHER): Payer: BC Managed Care – PPO | Admitting: Family Medicine

## 2018-04-29 ENCOUNTER — Encounter (INDEPENDENT_AMBULATORY_CARE_PROVIDER_SITE_OTHER): Payer: Self-pay

## 2018-05-02 ENCOUNTER — Telehealth: Payer: Self-pay

## 2018-05-02 DIAGNOSIS — E785 Hyperlipidemia, unspecified: Secondary | ICD-10-CM

## 2018-05-02 DIAGNOSIS — Z0000 Encounter for general adult medical examination without abnormal findings: Secondary | ICD-10-CM

## 2018-05-02 DIAGNOSIS — E039 Hypothyroidism, unspecified: Secondary | ICD-10-CM

## 2018-05-02 DIAGNOSIS — R7303 Prediabetes: Secondary | ICD-10-CM

## 2018-05-04 ENCOUNTER — Other Ambulatory Visit: Payer: Self-pay | Admitting: Internal Medicine

## 2018-05-05 ENCOUNTER — Other Ambulatory Visit: Payer: BC Managed Care – PPO | Admitting: Internal Medicine

## 2018-05-06 ENCOUNTER — Other Ambulatory Visit: Payer: BC Managed Care – PPO | Admitting: Internal Medicine

## 2018-05-06 DIAGNOSIS — R7303 Prediabetes: Secondary | ICD-10-CM

## 2018-05-06 DIAGNOSIS — E785 Hyperlipidemia, unspecified: Secondary | ICD-10-CM

## 2018-05-06 DIAGNOSIS — Z0000 Encounter for general adult medical examination without abnormal findings: Secondary | ICD-10-CM

## 2018-05-06 LAB — COMPREHENSIVE METABOLIC PANEL
AG Ratio: 1.6 (calc) (ref 1.0–2.5)
ALT: 24 U/L (ref 9–46)
AST: 22 U/L (ref 10–35)
Albumin: 4.1 g/dL (ref 3.6–5.1)
Alkaline phosphatase (APISO): 57 U/L (ref 40–115)
BUN: 20 mg/dL (ref 7–25)
CALCIUM: 9.4 mg/dL (ref 8.6–10.3)
CO2: 27 mmol/L (ref 20–32)
Chloride: 106 mmol/L (ref 98–110)
Creat: 0.95 mg/dL (ref 0.70–1.25)
Globulin: 2.6 g/dL (calc) (ref 1.9–3.7)
Glucose, Bld: 88 mg/dL (ref 65–99)
Potassium: 4.3 mmol/L (ref 3.5–5.3)
Sodium: 139 mmol/L (ref 135–146)
Total Bilirubin: 0.5 mg/dL (ref 0.2–1.2)
Total Protein: 6.7 g/dL (ref 6.1–8.1)

## 2018-05-06 LAB — PSA: PSA: 1.7 ng/mL (ref <=4.0)

## 2018-05-06 LAB — CBC WITH DIFFERENTIAL/PLATELET
Absolute Monocytes: 366 cells/uL (ref 200–950)
BASOS ABS: 31 {cells}/uL (ref 0–200)
Basophils Relative: 1 %
EOS PCT: 4.9 %
Eosinophils Absolute: 152 cells/uL (ref 15–500)
HCT: 39.3 % (ref 38.5–50.0)
Hemoglobin: 13.5 g/dL (ref 13.2–17.1)
Lymphs Abs: 1004 cells/uL (ref 850–3900)
MCH: 30.2 pg (ref 27.0–33.0)
MCHC: 34.4 g/dL (ref 32.0–36.0)
MCV: 87.9 fL (ref 80.0–100.0)
MPV: 9.9 fL (ref 7.5–12.5)
Monocytes Relative: 11.8 %
NEUTROS ABS: 1547 {cells}/uL (ref 1500–7800)
Neutrophils Relative %: 49.9 %
Platelets: 164 10*3/uL (ref 140–400)
RBC: 4.47 10*6/uL (ref 4.20–5.80)
RDW: 13.2 % (ref 11.0–15.0)
Total Lymphocyte: 32.4 %
WBC: 3.1 10*3/uL — ABNORMAL LOW (ref 3.8–10.8)

## 2018-05-06 LAB — HEMOGLOBIN A1C
HEMOGLOBIN A1C: 5.5 %{Hb} (ref <5.7)
Mean Plasma Glucose: 111 (calc)
eAG (mmol/L): 6.2 (calc)

## 2018-05-06 LAB — LIPID PANEL
Cholesterol: 171 mg/dL (ref <200)
HDL: 52 mg/dL (ref >40)
LDL CHOLESTEROL (CALC): 107 mg/dL — AB
Non-HDL Cholesterol (Calc): 119 mg/dL (calc) (ref <130)
Total CHOL/HDL Ratio: 3.3 (calc) (ref <5.0)
Triglycerides: 40 mg/dL (ref <150)

## 2018-05-09 ENCOUNTER — Encounter: Payer: Self-pay | Admitting: Internal Medicine

## 2018-05-09 ENCOUNTER — Ambulatory Visit (INDEPENDENT_AMBULATORY_CARE_PROVIDER_SITE_OTHER): Payer: BC Managed Care – PPO | Admitting: Internal Medicine

## 2018-05-09 ENCOUNTER — Encounter: Payer: BC Managed Care – PPO | Admitting: Internal Medicine

## 2018-05-09 VITALS — BP 110/80 | HR 77 | Ht 67.0 in | Wt 238.0 lb

## 2018-05-09 DIAGNOSIS — N401 Enlarged prostate with lower urinary tract symptoms: Secondary | ICD-10-CM

## 2018-05-09 DIAGNOSIS — Z0000 Encounter for general adult medical examination without abnormal findings: Secondary | ICD-10-CM

## 2018-05-09 DIAGNOSIS — E039 Hypothyroidism, unspecified: Secondary | ICD-10-CM | POA: Diagnosis not present

## 2018-05-09 DIAGNOSIS — Z6837 Body mass index (BMI) 37.0-37.9, adult: Secondary | ICD-10-CM | POA: Diagnosis not present

## 2018-05-09 DIAGNOSIS — E785 Hyperlipidemia, unspecified: Secondary | ICD-10-CM

## 2018-05-09 DIAGNOSIS — R351 Nocturia: Secondary | ICD-10-CM

## 2018-05-09 LAB — POCT URINALYSIS DIPSTICK
APPEARANCE: NEGATIVE
BILIRUBIN UA: NEGATIVE
Blood, UA: NEGATIVE
Glucose, UA: NEGATIVE
Ketones, UA: NEGATIVE
Leukocytes, UA: NEGATIVE
Nitrite, UA: NEGATIVE
Odor: NEGATIVE
PH UA: 6 (ref 5.0–8.0)
Protein, UA: NEGATIVE
Spec Grav, UA: 1.015 (ref 1.010–1.025)
Urobilinogen, UA: 0.2 E.U./dL

## 2018-05-09 NOTE — Progress Notes (Signed)
Subjective:    Patient ID: Nicholas Flores, male    DOB: 01-06-1956, 62 y.o.   MRN: [id number]  HPI Very pleasant 62 year old Black Male in today for health maintenance exam and evaluation of medical issues.  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  He is allergic to penicillin  Past medical history: History of lactose intolerance and irritable bowel syndrome.  Right bunionectomy by Dr. Charlsie Merlesegal of the mid 1990s.  History of toenail fungus.  He is status post tonsillectomy in the remote past and has a prior history of gout.  History of hyperlipidemia treated with statin.  History of hypothyroidism treated with thyroid replacement.  History of impaired glucose tolerance.  History of BPH.  Takes Flomax for BPH.  Social history: He is single and completed 4 years of college.  He used to smoke 1/2 pack cigarettes daily for more than 12 years but quit some 19 years ago.  He drinks red wine daily.  He is going to [facility] and has lost a good deal of weight.  Evaluated for chest pain May 2015 at [facility] [place] [facility] [place].  See care everywhere for details.  History of migraine headaches treated with Excedrin Migraine and occasional hydrocodone APAP.  Received flu vaccine through employment.  He had a quadriceps tendon repair, partial lateral meniscectomy and chondroplasty February 2016 by Dr. Garvin FilaLyman Flores at [facility] after [place].  Also developed some right ankle pain and had to be seen at [facility] in May 2016.  It was thought this was due more to weightbearing on the right lower extremity after his left knee surgery.  Family history: Twin brother in good health.  They enjoy going on cruises together.  Mother living in her 8280s with history of stroke.  His hemoglobin A1c is normal at 5.5%.   PSA is normal.  LDL slightly elevated at 107 and previously was normal in June.  Total cholesterol 171, HDL 52 triglycerides 40.  TSH was checked in October and is stable.  BMI is 37.28 with weight 238 pounds.  Last year he weighed 272 pounds.  Congratulated on weight loss efforts.    Review of Systems no new complaints     Objective:   Physical Exam Constitutional:      General: He is not in acute distress.    Appearance: Normal appearance. He is not diaphoretic.  HENT:     Head: Normocephalic and atraumatic.     Right Ear: Tympanic membrane normal.     Left Ear: Tympanic membrane normal.     Nose: Nose normal.     Mouth/Throat:     Mouth: Mucous membranes are moist.     Pharynx: Oropharynx is clear.  Eyes:     General: No scleral icterus.       Right eye: No discharge.        Left eye: No discharge.     Conjunctiva/sclera: Conjunctivae normal.  Neck:     Musculoskeletal: Neck supple.     Comments: No thyromegaly Cardiovascular:     Rate and Rhythm: Normal rate and regular rhythm.     Pulses: Normal pulses.     Heart sounds: Normal heart sounds. No murmur.  Pulmonary:     Effort: Pulmonary effort is normal. No respiratory distress.  Breath sounds: Normal breath sounds. No wheezing.  Abdominal:     General: Bowel sounds are normal.     Palpations: Abdomen is soft. There is no mass.     Tenderness: There is no abdominal tenderness. There is no guarding or rebound.     Comments: No organomegaly  Genitourinary:    Prostate: Normal.  Musculoskeletal:     Right lower leg: No edema.     Left lower leg: No edema.  Lymphadenopathy:     Cervical: No cervical adenopathy.  Skin:    General: Skin is warm and dry.  Neurological:     General: No focal deficit present.     Mental Status: He is alert and oriented to person, place, and time.     Cranial Nerves: No cranial nerve deficit.     Sensory: No sensory deficit.     Coordination: Coordination normal.     Deep Tendon  Reflexes: Reflexes normal.  Psychiatric:        Mood and Affect: Mood normal.        Behavior: Behavior normal.        Thought Content: Thought content normal.        Judgment: Judgment normal.           Assessment & Plan:  BMI 37-continue working with Nicholas Flores.  He has lost 34 pounds in the past year working with Nicholas Flores  History of impaired glucose tolerance-hemoglobin A1c normal  Hyperlipidemia-essentially normal lipid panel on statin  History of BPH treated with Flomax  History of migraine headaches-stable  Allergic rhinitis  Hypothyroidism-stable with current dose of thyroid replacement  Plan: Continue same medications and follow-up in 6 months

## 2018-05-18 ENCOUNTER — Encounter: Payer: Self-pay | Admitting: Internal Medicine

## 2018-05-18 NOTE — Patient Instructions (Signed)
It was a pleasure to see you today.  Congratulations on your retirement and your efforts at weight loss.  Return in 6 months.  Continue same medications.

## 2018-05-20 ENCOUNTER — Encounter: Payer: BC Managed Care – PPO | Admitting: Internal Medicine

## 2018-05-29 ENCOUNTER — Other Ambulatory Visit (INDEPENDENT_AMBULATORY_CARE_PROVIDER_SITE_OTHER): Payer: Self-pay | Admitting: Family Medicine

## 2018-05-29 DIAGNOSIS — E559 Vitamin D deficiency, unspecified: Secondary | ICD-10-CM

## 2018-06-02 ENCOUNTER — Other Ambulatory Visit: Payer: Self-pay | Admitting: Internal Medicine

## 2018-06-17 ENCOUNTER — Ambulatory Visit (INDEPENDENT_AMBULATORY_CARE_PROVIDER_SITE_OTHER): Payer: BC Managed Care – PPO | Admitting: Family Medicine

## 2018-06-17 VITALS — BP 126/71 | HR 87 | Ht 67.0 in | Wt 232.0 lb

## 2018-06-17 DIAGNOSIS — E7849 Other hyperlipidemia: Secondary | ICD-10-CM | POA: Diagnosis not present

## 2018-06-17 DIAGNOSIS — Z6836 Body mass index (BMI) 36.0-36.9, adult: Secondary | ICD-10-CM

## 2018-06-17 DIAGNOSIS — Z9189 Other specified personal risk factors, not elsewhere classified: Secondary | ICD-10-CM | POA: Diagnosis not present

## 2018-06-17 DIAGNOSIS — E559 Vitamin D deficiency, unspecified: Secondary | ICD-10-CM

## 2018-06-17 MED ORDER — VITAMIN D (ERGOCALCIFEROL) 1.25 MG (50000 UNIT) PO CAPS: 50000.0000 [IU] | ORAL_CAPSULE | ORAL | 0 refills | Status: DC

## 2018-06-18 NOTE — Progress Notes (Signed)
Office: [phone]  /  Fax: [phone]   HPI:   Chief Complaint: OBESITY Nicholas Flores is here to discuss his progress with his obesity treatment plan. He is keep a food journal with 400 to 550 calories and 40+ grams of protein for supper and follow the Category 3 plan and is following his eating plan approximately 95 % of the time. He states he is walking and using weights 35 to 60 minutes 4 to 6 times per week. Nicholas Flores continues to do well with weight loss. He was changed to journaling for dinner and he likes the freedom this gives him.  His weight is 232 lb (105.2 kg) today and has had a weight loss of 2 pounds over a period of 11 weeks since his last visit. He has lost 31 lbs since starting treatment with us.  Vitamin D deficiency Nicholas Flores has a diagnosis of vitamin D deficiency. He is currently stable on vit D and denies nausea, vomiting, or muscle weakness.  Hyperlipidemia Nicholas Flores has hyperlipidemia and has been trying to improve his cholesterol levels with intensive lifestyle modification including a low saturated fat diet, exercise and weight loss. He is stable on his diet and denies any chest pain or myalgias.  At risk for cardiovascular disease Nicholas Flores is at a higher than average risk for cardiovascular disease due to hyperlipidemia and obesity. He currently denies any chest pain.  ASSESSMENT AND PLAN:  Vitamin D deficiency - Plan: Vitamin D, Ergocalciferol, (DRISDOL) 1.25 MG (50000 UT) CAPS capsule  Other hyperlipidemia  At risk for heart disease  Class 2 severe obesity with serious comorbidity and body mass index (BMI) of 36.0 to 36.9 in adult, unspecified obesity type (HCC)  PLAN:  Vitamin D Deficiency Nicholas Flores was informed that low vitamin D levels contributes to fatigue and are associated with obesity, breast, and colon cancer. He agrees to continue to take prescription Vit D @50 ,000 IU every week #4 with no refills and will follow up for routine testing of vitamin D, at  least 2-3 times per year. He was informed of the risk of over-replacement of vitamin D and agrees to not increase his dose unless he discusses this with us first. Nicholas Flores agrees to follow up in 4 weeks.  Hyperlipidemia Nicholas Flores was informed of the American Heart Association Guidelines emphasizing intensive lifestyle modifications as the first line treatment for hyperlipidemia. We discussed many lifestyle modifications today in depth, and Nicholas Flores will continue to work on decreasing saturated fats such as fatty red meat, butter and many fried foods. He will also increase vegetables and lean protein in his diet and continue to work on exercise and weight loss efforts. Nicholas Flores agrees to continue his statin and diet. We will check labs in 2 months and he will follow up as directed.  Cardiovascular risk counseling Nicholas Flores was given extended (15 minutes) coronary artery disease prevention counseling today. He is 63 y.o. male and has risk factors for heart disease including hyperlipidemia and obesity. We discussed intensive lifestyle modifications today with an emphasis on specific weight loss instructions and strategies. Pt was also informed of the importance of increasing exercise and decreasing saturated fats to help prevent heart disease.  Obesity Nicholas Flores is currently in the action stage of change. As such, his goal is to continue with weight loss efforts He has agreed to follow the Category 3 plan and keep a food journal of 400 to 550 calories and 40+ grams of protein for supper. Nicholas Flores has been instructed to work up to a  goal of 150 minutes of combined cardio and strengthening exercise per week for weight loss and overall health benefits.  Nicholas Flores has agreed to follow up with our clinic in 4 weeks. He was informed of the importance of frequent follow up visits to maximize his success with intensive lifestyle modifications for his multiple health conditions.  ALLERGIES: Allergies  Allergen Reactions  .  Penicillins Rash  . Botox [Botulinum Toxin Type A]     Blurred vision    MEDICATIONS: Current Outpatient Medications on File Prior to Visit  Medication Sig Dispense Refill  . Blood Pressure Monitoring (BLOOD PRESSURE MONITOR 7) DEVI U UTD  0  . HYDROcodone-acetaminophen (NORCO) 10-325 MG tablet Take 1 tablet by mouth every 4 (four) hours as needed. 30 tablet 0  . levothyroxine (SYNTHROID, LEVOTHROID) 100 MCG tablet TAKE 1 TABLET BY MOUTH EVERY DAY 90 tablet 0  . mometasone (NASONEX) 50 MCG/ACT nasal spray USE 2 SPRAYS EACH NOSTRIL EVERY DAY AS NEEDED 17 g 11  . simvastatin (ZOCOR) 10 MG tablet TAKE 1 TABLET (10 MG TOTAL) BY MOUTH AT BEDTIME. 90 tablet 1  . SYSTANE ULTRA PF 0.4-0.3 % SOLN INSTILL ONE DROP INTO RIGHT EYE 4 TIMES A DAY  4  . tamsulosin (FLOMAX) 0.4 MG CAPS capsule Take 1 capsule (0.4 mg total) by mouth 2 (two) times daily. 180 capsule 3   No current facility-administered medications on file prior to visit.     PAST MEDICAL HISTORY: Past Medical History:  Diagnosis Date  . BPH (benign prostatic hyperplasia)   . Migraines   . Over weight     PAST SURGICAL HISTORY: Past Surgical History:  Procedure Laterality Date  . BUNIONECTOMY    . Left knee surgery    . Left quadricep surgery    . TONSILLECTOMY      SOCIAL HISTORY: Social History   Tobacco Use  . Smoking status: Former Smoker    Types: Cigars  . Smokeless tobacco: Never Used  Substance Use Topics  . Alcohol use: Yes    Alcohol/week: 13.0 standard drinks    Types: 1 Glasses of wine, 12 Standard drinks or equivalent per week  . Drug use: No    FAMILY HISTORY: Family History  Problem Relation Age of Onset  . Stroke Mother   . Diabetes Mother   . Hypertension Mother   . Obesity Mother     ROS: Review of Systems  Constitutional: Positive for weight loss.  Cardiovascular: Negative for chest pain.  Gastrointestinal: Negative for nausea and vomiting.  Musculoskeletal: Negative for myalgias.        Negative for muscle weakness.    PHYSICAL EXAM: Blood pressure 126/71, pulse 87, height 5\' 7"  (1.702 m), weight 232 lb (105.2 kg), SpO2 98 %. Body mass index is 36.34 kg/m. Physical Exam Vitals signs reviewed.  Constitutional:      Appearance: Normal appearance. He is obese.  Cardiovascular:     Rate and Rhythm: Normal rate.  Pulmonary:     Effort: Pulmonary effort is normal.  Musculoskeletal: Normal range of motion.  Skin:    General: Skin is warm and dry.  Neurological:     Mental Status: He is alert and oriented to person, place, and time.  Psychiatric:        Mood and Affect: Mood normal.        Behavior: Behavior normal.     RECENT LABS AND TESTS: BMET    Component Value Date/Time   NA 139 05/06/2018 1033  NA 141 01/02/2018 0801   NA 142 08/18/2011 0527   K 4.3 05/06/2018 1033   K 3.5 08/18/2011 0527   CL 106 05/06/2018 1033   CL 110 (H) 08/18/2011 0527   CO2 27 05/06/2018 1033   CO2 21 08/18/2011 0527   GLUCOSE 88 05/06/2018 1033   GLUCOSE 86 08/18/2011 0527   BUN 20 05/06/2018 1033   BUN 26 01/02/2018 0801   BUN 13 08/18/2011 0527   CREATININE 0.95 05/06/2018 1033   CALCIUM 9.4 05/06/2018 1033   CALCIUM 7.8 (L) 08/18/2011 0527   GFRNONAA 60 01/02/2018 0801   GFRNONAA 63 05/02/2017 0945   GFRAA 69 01/02/2018 0801   GFRAA 73 05/02/2017 0945   Lab Results  Component Value Date   HGBA1C 5.5 05/06/2018   HGBA1C 5.8 (H) 01/02/2018   HGBA1C 5.8 (H) 07/23/2017   Lab Results  Component Value Date   INSULIN 29.5 (H) 01/02/2018   INSULIN 34.0 (H) 07/23/2017   CBC    Component Value Date/Time   WBC 3.1 (L) 05/06/2018 1033   RBC 4.47 05/06/2018 1033   HGB 13.5 05/06/2018 1033   HGB 13.5 07/23/2017 0943   HCT 39.3 05/06/2018 1033   HCT 40.3 07/23/2017 0943   PLT 164 05/06/2018 1033   PLT 126 (L) 08/18/2011 0527   MCV 87.9 05/06/2018 1033   MCV 88 07/23/2017 0943   MCV 91 08/18/2011 0527   MCH 30.2 05/06/2018 1033   MCHC 34.4 05/06/2018 1033    RDW 13.2 05/06/2018 1033   RDW 14.8 07/23/2017 0943   RDW 14.5 08/18/2011 0527   LYMPHSABS 1,004 05/06/2018 1033   LYMPHSABS 0.8 07/23/2017 0943   LYMPHSABS 0.8 (L) 08/18/2011 0527   MONOABS 380 04/27/2016 1120   MONOABS 0.7 08/18/2011 0527   EOSABS 152 05/06/2018 1033   EOSABS 0.1 07/23/2017 0943   EOSABS 0.0 08/18/2011 0527   BASOSABS 31 05/06/2018 1033   BASOSABS 0.0 07/23/2017 0943   BASOSABS 0.0 08/18/2011 0527   Iron/TIBC/Ferritin/ %Sat No results found for: IRON, TIBC, FERRITIN, IRONPCTSAT Lipid Panel     Component Value Date/Time   CHOL 171 05/06/2018 1033   CHOL 158 07/23/2017 0943   TRIG 40 05/06/2018 1033   HDL 52 05/06/2018 1033   HDL 50 07/23/2017 0943   CHOLHDL 3.3 05/06/2018 1033   VLDL 12 10/26/2016 1216   LDLCALC 107 (H) 05/06/2018 1033   Hepatic Function Panel     Component Value Date/Time   PROT 6.7 05/06/2018 1033   PROT 7.5 01/02/2018 0801   PROT 6.2 (L) 08/18/2011 0527   ALBUMIN 4.5 01/02/2018 0801   ALBUMIN 2.9 (L) 08/18/2011 0527   AST 22 05/06/2018 1033   AST 18 08/18/2011 0527   ALT 24 05/06/2018 1033   ALT 25 08/18/2011 0527   ALKPHOS 63 01/02/2018 0801   ALKPHOS 56 08/18/2011 0527   BILITOT 0.5 05/06/2018 1033   BILITOT 0.3 01/02/2018 0801   BILITOT 0.3 08/18/2011 0527   BILIDIR 0.1 10/28/2017 1216   IBILI 0.3 10/28/2017 1216      Component Value Date/Time   TSH 1.68 02/24/2018 1005   TSH 4.110 01/02/2018 0801   TSH 5.84 (H) 10/28/2017 1216   Results for RYIAN, Nicholas Flores "BOZ" (MRN [id number]) as of 06/18/2018 12:58  Ref. Range 01/02/2018 08:01  Vitamin D, 25-Hydroxy Latest Ref Range: 30.0 - 100.0 ng/mL 31.9    OBESITY BEHAVIORAL INTERVENTION VISIT  Today's visit was # 12   Starting weight: 263 lbs Starting date: 07/23/17  Today's weight : Weight: 232 lb (105.2 kg)  Today's date: 06/17/2018 Total lbs lost to date: 31  ASK: We discussed the diagnosis of obesity with Romero Belling today and Garland agreed to give [name]  permission to discuss obesity behavioral modification therapy today.  ASSESS: Kaisei has the diagnosis of obesity and his BMI today is 36.3. Zedric is in the action stage of change.  ADVISE: Khiem was educated on the multiple health risks of obesity as well as the benefit of weight loss to improve his health. He was advised of the need for long term treatment and the importance of lifestyle modifications to improve his current health and to decrease his risk of future health problems.  AGREE: Multiple dietary modification options and treatment options were discussed and Jarquez agreed to follow the recommendations documented in the above note.  ARRANGE: Ottavio was educated on the importance of frequent visits to treat obesity as outlined per CMS and USPSTF guidelines and agreed to schedule his next follow up appointment today.  I, Kirke Corin, am acting as transcriptionist for Wilder Glade, MD  I have reviewed the above documentation for accuracy and completeness, and I agree with the above. -Quillian Quince, MD

## 2018-07-17 ENCOUNTER — Ambulatory Visit (INDEPENDENT_AMBULATORY_CARE_PROVIDER_SITE_OTHER): Payer: BC Managed Care – PPO | Admitting: Family Medicine

## 2018-07-17 ENCOUNTER — Encounter (INDEPENDENT_AMBULATORY_CARE_PROVIDER_SITE_OTHER): Payer: Self-pay | Admitting: Family Medicine

## 2018-07-17 VITALS — BP 121/63 | HR 98 | Ht 67.0 in | Wt 231.0 lb

## 2018-07-17 DIAGNOSIS — Z6836 Body mass index (BMI) 36.0-36.9, adult: Secondary | ICD-10-CM | POA: Diagnosis not present

## 2018-07-17 DIAGNOSIS — Z9189 Other specified personal risk factors, not elsewhere classified: Secondary | ICD-10-CM | POA: Diagnosis not present

## 2018-07-17 DIAGNOSIS — E559 Vitamin D deficiency, unspecified: Secondary | ICD-10-CM | POA: Diagnosis not present

## 2018-07-17 MED ORDER — VITAMIN D (ERGOCALCIFEROL) 1.25 MG (50000 UNIT) PO CAPS: 50000.0000 [IU] | ORAL_CAPSULE | ORAL | 0 refills | Status: DC

## 2018-07-17 NOTE — Progress Notes (Signed)
Office: [phone]  /  Fax: [phone]   HPI:   Chief Complaint: OBESITY Nicholas Flores is here to discuss his progress with his obesity treatment plan. He is on the Category 3 plan and is following his eating plan approximately 90 to 93 % of the time. He states he is walking 3 miles and using weights for 30 to 60 minutes 6 times per week. Nicholas Flores continues to do well with weight loss. He is moving to [place] the end of March. He notes increased eating out, but doing well with portion control and smarter choices during those times. His hunger is controlled.  His weight is 231 lb (104.8 kg) today and has had a weight loss of 1 pounds over a period of 4 weeks since his last visit. He has lost 32 lbs since starting treatment with [name].  Vitamin D deficiency Strider has a diagnosis of vitamin D deficiency. He is currently taking vit D and denies nausea, vomiting, or muscle weakness.  At risk for osteopenia and osteoporosis Lonn is at higher risk of osteopenia and osteoporosis due to vitamin D deficiency.   ASSESSMENT AND PLAN:  Vitamin D deficiency - Plan: Vitamin D, Ergocalciferol, (DRISDOL) 1.25 MG (50000 UT) CAPS capsule  At risk for osteoporosis  Class 2 severe obesity with serious comorbidity and body mass index (BMI) of 36.0 to 36.9 in adult, unspecified obesity type (HCC)  PLAN:  Vitamin D Deficiency [name] was informed that low vitamin D levels contributes to fatigue and are associated with obesity, breast, and colon cancer. [name] agrees to continue to take prescription Vit D ,000 IU every week #4 with no refills and will follow up for routine testing of vitamin D, at least 2-3 times per year. He was informed of the risk of over-replacement of vitamin D and agrees to not increase her dose unless she discusses this with [name] first. [name] agrees to follow up in 3 to 4 weeks as directed.  At risk for osteopenia and osteoporosis [name] was given extended (15 minutes) osteoporosis  prevention counseling today. [name] is at risk for osteopenia and osteoporosis due to his vitamin D deficiency. He was encouraged to take his vitamin D and follow his higher calcium diet and increase strengthening exercise to help strengthen his bones and decrease his risk of osteopenia and osteoporosis.  Obesity [name] is currently in the action stage of change. As such, his goal is to continue with weight loss efforts. He has agreed to keep a food journal with 400 to 550 calories and 40 grams of protein for supper and follow the Category 3 plan. [name] has been instructed to work up to a goal of 150 minutes of combined cardio and strengthening exercise per week for weight loss and overall health benefits. We discussed the following Behavioral Modification Strategies today: increasing lean protein intake, increasing vegetables, work on meal planning and easy cooking plans, and travel eating strategies.   [name] has agreed to follow up with our clinic in 3 to 4 weeks. He was informed of the importance of frequent follow up visits to maximize his success with intensive lifestyle modifications for his multiple health conditions.  ALLERGIES: Allergies  Allergen Reactions  . Penicillins Rash  . Botox [Botulinum Toxin Type A]     Blurred vision    MEDICATIONS: Current Outpatient Medications on File Prior to Visit  Medication Sig Dispense Refill  . Blood Pressure Monitoring (BLOOD PRESSURE MONITOR 7) DEVI U UTD  0  . HYDROcodone-acetaminophen (NORCO) 10-325 MG  tablet Take 1 tablet by mouth every 4 (four) hours as needed. 30 tablet 0  . levothyroxine (SYNTHROID, LEVOTHROID) 100 MCG tablet TAKE 1 TABLET BY MOUTH EVERY DAY 90 tablet 0  . mometasone (NASONEX) 50 MCG/ACT nasal spray USE 2 SPRAYS EACH NOSTRIL EVERY DAY AS NEEDED 17 g 11  . simvastatin (ZOCOR) 10 MG tablet TAKE 1 TABLET (10 MG TOTAL) BY MOUTH AT BEDTIME. 90 tablet 1  . SYSTANE ULTRA PF 0.4-0.3 % SOLN INSTILL ONE DROP INTO RIGHT EYE 4  TIMES A DAY  4  . tamsulosin (FLOMAX) 0.4 MG CAPS capsule Take 1 capsule (0.4 mg total) by mouth 2 (two) times daily. 180 capsule 3   No current facility-administered medications on file prior to visit.     PAST MEDICAL HISTORY: Past Medical History:  Diagnosis Date  . BPH (benign prostatic hyperplasia)   . Migraines   . Over weight     PAST SURGICAL HISTORY: Past Surgical History:  Procedure Laterality Date  . BUNIONECTOMY    . Left knee surgery    . Left quadricep surgery    . TONSILLECTOMY      SOCIAL HISTORY: Social History   Tobacco Use  . Smoking status: Former Smoker    Types: Cigars  . Smokeless tobacco: Never Used  Substance Use Topics  . Alcohol use: Yes    Alcohol/week: 13.0 standard drinks    Types: 1 Glasses of wine, 12 Standard drinks or equivalent per week  . Drug use: No    FAMILY HISTORY: Family History  Problem Relation Age of Onset  . Stroke Mother   . Diabetes Mother   . Hypertension Mother   . Obesity Mother     ROS: Review of Systems  Constitutional: Positive for weight loss.  Gastrointestinal: Negative for nausea and vomiting.  Musculoskeletal:       Negative for muscle weakness.    PHYSICAL EXAM: Blood pressure 121/63, pulse 98, height 5\' 7"  (1.702 m), weight 231 lb (104.8 kg), SpO2 98 %. Body mass index is 36.18 kg/m. Physical Exam Vitals signs reviewed.  Constitutional:      Appearance: Normal appearance. He is obese.  Cardiovascular:     Rate and Rhythm: Normal rate.  Pulmonary:     Effort: Pulmonary effort is normal.  Musculoskeletal: Normal range of motion.  Skin:    General: Skin is warm and dry.  Neurological:     Mental Status: He is alert and oriented to person, place, and time.  Psychiatric:        Mood and Affect: Mood normal.        Behavior: Behavior normal.     RECENT LABS AND TESTS: BMET    Component Value Date/Time   NA 139 05/06/2018 1033   NA 141 01/02/2018 0801   NA 142 08/18/2011 0527   K  4.3 05/06/2018 1033   K 3.5 08/18/2011 0527   CL 106 05/06/2018 1033   CL 110 (H) 08/18/2011 0527   CO2 27 05/06/2018 1033   CO2 21 08/18/2011 0527   GLUCOSE 88 05/06/2018 1033   GLUCOSE 86 08/18/2011 0527   BUN 20 05/06/2018 1033   BUN 26 01/02/2018 0801   BUN 13 08/18/2011 0527   CREATININE 0.95 05/06/2018 1033   CALCIUM 9.4 05/06/2018 1033   CALCIUM 7.8 (L) 08/18/2011 0527   GFRNONAA 60 01/02/2018 0801   GFRNONAA 63 05/02/2017 0945   GFRAA 69 01/02/2018 0801   GFRAA 73 05/02/2017 0945   Lab Results  Component Value Date   HGBA1C 5.5 05/06/2018   HGBA1C 5.8 (H) 01/02/2018   HGBA1C 5.8 (H) 07/23/2017   Lab Results  Component Value Date   INSULIN 29.5 (H) 01/02/2018   INSULIN 34.0 (H) 07/23/2017   CBC    Component Value Date/Time   WBC 3.1 (L) 05/06/2018 1033   RBC 4.47 05/06/2018 1033   HGB 13.5 05/06/2018 1033   HGB 13.5 07/23/2017 0943   HCT 39.3 05/06/2018 1033   HCT 40.3 07/23/2017 0943   PLT 164 05/06/2018 1033   PLT 126 (L) 08/18/2011 0527   MCV 87.9 05/06/2018 1033   MCV 88 07/23/2017 0943   MCV 91 08/18/2011 0527   MCH 30.2 05/06/2018 1033   MCHC 34.4 05/06/2018 1033   RDW 13.2 05/06/2018 1033   RDW 14.8 07/23/2017 0943   RDW 14.5 08/18/2011 0527   LYMPHSABS 1,004 05/06/2018 1033   LYMPHSABS 0.8 07/23/2017 0943   LYMPHSABS 0.8 (L) 08/18/2011 0527   MONOABS 380 04/27/2016 1120   MONOABS 0.7 08/18/2011 0527   EOSABS 152 05/06/2018 1033   EOSABS 0.1 07/23/2017 0943   EOSABS 0.0 08/18/2011 0527   BASOSABS 31 05/06/2018 1033   BASOSABS 0.0 07/23/2017 0943   BASOSABS 0.0 08/18/2011 0527   Iron/TIBC/Ferritin/ %Sat No results found for: IRON, TIBC, FERRITIN, IRONPCTSAT Lipid Panel     Component Value Date/Time   CHOL 171 05/06/2018 1033   CHOL 158 07/23/2017 0943   TRIG 40 05/06/2018 1033   HDL 52 05/06/2018 1033   HDL 50 07/23/2017 0943   CHOLHDL 3.3 05/06/2018 1033   VLDL 12 10/26/2016 1216   LDLCALC 107 (H) 05/06/2018 1033   Hepatic  Function Panel     Component Value Date/Time   PROT 6.7 05/06/2018 1033   PROT 7.5 01/02/2018 0801   PROT 6.2 (L) 08/18/2011 0527   ALBUMIN 4.5 01/02/2018 0801   ALBUMIN 2.9 (L) 08/18/2011 0527   AST 22 05/06/2018 1033   AST 18 08/18/2011 0527   ALT 24 05/06/2018 1033   ALT 25 08/18/2011 0527   ALKPHOS 63 01/02/2018 0801   ALKPHOS 56 08/18/2011 0527   BILITOT 0.5 05/06/2018 1033   BILITOT 0.3 01/02/2018 0801   BILITOT 0.3 08/18/2011 0527   BILIDIR 0.1 10/28/2017 1216   IBILI 0.3 10/28/2017 1216      Component Value Date/Time   TSH 1.68 02/24/2018 1005   TSH 4.110 01/02/2018 0801   TSH 5.84 (H) 10/28/2017 1216   Results for GOBLE, FUDALA "BOZ" (MRN [id number]) as of 07/17/2018 14:51  Ref. Range 01/02/2018 08:01  Vitamin D, 25-Hydroxy Latest Ref Range: 30.0 - 100.0 ng/mL 31.9   OBESITY BEHAVIORAL INTERVENTION VISIT  Today's visit was # 13   Starting weight: 263 lbs Starting date: 07/23/17 Today's weight : Weight: 231 lb (104.8 kg)  Today's date: 07/17/2018 Total lbs lost to date: 32    07/17/2018  Height  (1.702 m)  Weight 231 lb (104.8 kg)  BMI (Calculated) 36.17  BLOOD PRESSURE - SYSTOLIC 121  BLOOD PRESSURE - DIASTOLIC 63   Body Fat % 36.3 %  Total Body Water (lbs) 113 lbs   ASK: We discussed the diagnosis of obesity with Romero Belling today and Haile agreed to give [name] permission to discuss obesity behavioral modification therapy today.  ASSESS: Terril has the diagnosis of obesity and his BMI today is 36.17. Muzamil is in the action stage of change.   ADVISE: Dyshaun was educated on the multiple health risks of  obesity as well as the benefit of weight loss to improve his health. He was advised of the need for long term treatment and the importance of lifestyle modifications to improve his current health and to decrease his risk of future health problems.  AGREE: Multiple dietary modification options and treatment options were discussed and Baylon  agreed to follow the recommendations documented in the above note.  ARRANGE: Lorcan was educated on the importance of frequent visits to treat obesity as outlined per CMS and USPSTF guidelines and agreed to schedule his next follow up appointment today.  IKirke Corin, CMA, am acting as transcriptionist for Wilder Glade, MD  I have reviewed the above documentation for accuracy and completeness, and I agree with the above. -Quillian Quince, MD

## 2018-08-12 ENCOUNTER — Other Ambulatory Visit (INDEPENDENT_AMBULATORY_CARE_PROVIDER_SITE_OTHER): Payer: Self-pay | Admitting: Family Medicine

## 2018-08-12 DIAGNOSIS — E559 Vitamin D deficiency, unspecified: Secondary | ICD-10-CM

## 2018-08-14 ENCOUNTER — Encounter (INDEPENDENT_AMBULATORY_CARE_PROVIDER_SITE_OTHER): Payer: Self-pay

## 2018-08-14 ENCOUNTER — Ambulatory Visit (INDEPENDENT_AMBULATORY_CARE_PROVIDER_SITE_OTHER): Payer: BC Managed Care – PPO | Admitting: Family Medicine

## 2018-08-20 ENCOUNTER — Other Ambulatory Visit (INDEPENDENT_AMBULATORY_CARE_PROVIDER_SITE_OTHER): Payer: Self-pay | Admitting: Family Medicine

## 2018-08-20 DIAGNOSIS — E559 Vitamin D deficiency, unspecified: Secondary | ICD-10-CM

## 2018-08-21 ENCOUNTER — Other Ambulatory Visit: Payer: Self-pay

## 2018-08-21 ENCOUNTER — Encounter (INDEPENDENT_AMBULATORY_CARE_PROVIDER_SITE_OTHER): Payer: Self-pay

## 2018-08-21 DIAGNOSIS — E559 Vitamin D deficiency, unspecified: Secondary | ICD-10-CM

## 2018-08-21 MED ORDER — VITAMIN D (ERGOCALCIFEROL) 1.25 MG (50000 UNIT) PO CAPS: 50000.0000 [IU] | ORAL_CAPSULE | ORAL | 2 refills | Status: DC

## 2018-08-21 NOTE — Telephone Encounter (Signed)
Patient calld to rquest a refill on his Drisdol.  [facility] [address]

## 2018-08-22 ENCOUNTER — Telehealth: Payer: Self-pay | Admitting: Internal Medicine

## 2018-08-22 MED ORDER — MOMETASONE FUROATE 50 MCG/ACT NA SUSP: NASAL | 11 refills | Status: AC

## 2018-08-22 NOTE — Telephone Encounter (Signed)
[place] Gorski [phone]  [facility] [address]  mometasone (NASONEX) 50 MCG/ACT nasal spray   Barack called to say that he needs a refill, when he tried to [facility], it said he was out of refills.

## 2018-10-07 ENCOUNTER — Telehealth: Payer: Self-pay | Admitting: Internal Medicine

## 2018-10-07 ENCOUNTER — Encounter: Payer: Self-pay | Admitting: Internal Medicine

## 2018-10-07 DIAGNOSIS — B356 Tinea cruris: Secondary | ICD-10-CM

## 2018-10-07 MED ORDER — CLOTRIMAZOLE-BETAMETHASONE 1-0.05 % EX CREA: 1.0000 "application " | TOPICAL_CREAM | Freq: Two times a day (BID) | CUTANEOUS | 1 refills | Status: DC

## 2018-10-07 NOTE — Telephone Encounter (Addendum)
We will call in  Prescription cream for him. OV if not improving in 5-7 days   Rx: Lotrisone cream bid 30 grams with 1 refill

## 2018-10-07 NOTE — Telephone Encounter (Signed)
Called patient he acknowledge understanding and ask for prescription to [facility] [address]

## 2018-10-07 NOTE — Telephone Encounter (Signed)
Nicholas Flores [phone]  Nicholas Flores called to say that he has been running and now has [employer], he has tried over the counter [facility] antifungal powder, it soothes it, but does not heel. He was wandering if you could call in the antifungal medicine that you called in awhile back. Okay with virtual visit.

## 2018-10-09 ENCOUNTER — Other Ambulatory Visit: Payer: Self-pay | Admitting: Internal Medicine

## 2018-11-05 ENCOUNTER — Telehealth: Payer: Self-pay | Admitting: Internal Medicine

## 2018-11-05 ENCOUNTER — Encounter: Payer: Self-pay | Admitting: Internal Medicine

## 2018-11-05 ENCOUNTER — Other Ambulatory Visit: Payer: Self-pay | Admitting: Internal Medicine

## 2018-11-05 DIAGNOSIS — E559 Vitamin D deficiency, unspecified: Secondary | ICD-10-CM

## 2018-11-05 MED ORDER — LEVOTHYROXINE SODIUM 100 MCG PO TABS: 100.0000 ug | ORAL_TABLET | Freq: Every day | ORAL | 0 refills | Status: DC

## 2018-11-05 MED ORDER — VITAMIN D (ERGOCALCIFEROL) 1.25 MG (50000 UNIT) PO CAPS: 50000.0000 [IU] | ORAL_CAPSULE | ORAL | 0 refills | Status: DC

## 2018-11-05 NOTE — Telephone Encounter (Signed)
Sent Vit D refill to [facility] in [place] and also at [facility] in [place]. Got 2 different requests fax and escribe

## 2018-11-05 NOTE — Telephone Encounter (Signed)
Refilled levothyroxine and Drisdol x 30 days Has appt late June

## 2018-11-07 ENCOUNTER — Other Ambulatory Visit: Payer: Self-pay | Admitting: Internal Medicine

## 2018-11-10 ENCOUNTER — Other Ambulatory Visit: Payer: Self-pay | Admitting: Internal Medicine

## 2018-11-10 ENCOUNTER — Other Ambulatory Visit: Payer: BC Managed Care – PPO | Admitting: Internal Medicine

## 2018-11-10 ENCOUNTER — Other Ambulatory Visit: Payer: Self-pay

## 2018-11-10 DIAGNOSIS — E7849 Other hyperlipidemia: Secondary | ICD-10-CM

## 2018-11-10 DIAGNOSIS — E039 Hypothyroidism, unspecified: Secondary | ICD-10-CM

## 2018-11-10 DIAGNOSIS — E559 Vitamin D deficiency, unspecified: Secondary | ICD-10-CM

## 2018-11-10 DIAGNOSIS — R7303 Prediabetes: Secondary | ICD-10-CM

## 2018-11-10 DIAGNOSIS — Z5181 Encounter for therapeutic drug level monitoring: Secondary | ICD-10-CM

## 2018-11-10 DIAGNOSIS — E785 Hyperlipidemia, unspecified: Secondary | ICD-10-CM

## 2018-11-10 DIAGNOSIS — R7302 Impaired glucose tolerance (oral): Secondary | ICD-10-CM

## 2018-11-10 NOTE — Progress Notes (Signed)
Labs drawn and pt has appt in near future

## 2018-11-12 LAB — HEPATIC FUNCTION PANEL
AG Ratio: 1.7 (calc) (ref 1.0–2.5)
ALT: 30 U/L (ref 9–46)
AST: 27 U/L (ref 10–35)
Albumin: 4.3 g/dL (ref 3.6–5.1)
Alkaline phosphatase (APISO): 55 U/L (ref 35–144)
Bilirubin, Direct: 0.1 mg/dL (ref 0.0–0.2)
Globulin: 2.6 g/dL (calc) (ref 1.9–3.7)
Indirect Bilirubin: 0.3 mg/dL (calc) (ref 0.2–1.2)
Total Bilirubin: 0.4 mg/dL (ref 0.2–1.2)
Total Protein: 6.9 g/dL (ref 6.1–8.1)

## 2018-11-12 LAB — HEMOGLOBIN A1C
Hgb A1c MFr Bld: 5.5 % of total Hgb (ref <5.7)
Mean Plasma Glucose: 111 (calc)
eAG (mmol/L): 6.2 (calc)

## 2018-11-12 LAB — LIPID PANEL
Cholesterol: 165 mg/dL (ref <200)
HDL: 57 mg/dL (ref >=40)
LDL Cholesterol (Calc): 95 mg/dL (calc)
Non-HDL Cholesterol (Calc): 108 mg/dL (calc) (ref <130)
Total CHOL/HDL Ratio: 2.9 (calc) (ref <5.0)
Triglycerides: 43 mg/dL (ref <150)

## 2018-11-12 LAB — HOUSE ACCOUNT TRACKING

## 2018-11-12 LAB — TSH: TSH: 1.37 mIU/L (ref 0.40–4.50)

## 2018-11-12 LAB — VITAMIN D 25 HYDROXY (VIT D DEFICIENCY, FRACTURES): Vit D, 25-Hydroxy: 40 ng/mL (ref 30–100)

## 2018-11-13 ENCOUNTER — Ambulatory Visit (INDEPENDENT_AMBULATORY_CARE_PROVIDER_SITE_OTHER): Payer: BC Managed Care – PPO | Admitting: Internal Medicine

## 2018-11-13 ENCOUNTER — Other Ambulatory Visit: Payer: Self-pay

## 2018-11-13 ENCOUNTER — Encounter: Payer: Self-pay | Admitting: Internal Medicine

## 2018-11-13 VITALS — BP 100/70 | HR 80 | Temp 98.6°F | Ht 67.0 in | Wt 240.0 lb

## 2018-11-13 DIAGNOSIS — E7800 Pure hypercholesterolemia, unspecified: Secondary | ICD-10-CM

## 2018-11-13 DIAGNOSIS — Z6836 Body mass index (BMI) 36.0-36.9, adult: Secondary | ICD-10-CM

## 2018-11-13 DIAGNOSIS — R7302 Impaired glucose tolerance (oral): Secondary | ICD-10-CM | POA: Diagnosis not present

## 2018-11-13 DIAGNOSIS — Z6837 Body mass index (BMI) 37.0-37.9, adult: Secondary | ICD-10-CM | POA: Diagnosis not present

## 2018-11-13 DIAGNOSIS — E039 Hypothyroidism, unspecified: Secondary | ICD-10-CM

## 2018-11-13 NOTE — Progress Notes (Signed)
   Subjective:    Patient ID: Nicholas Flores, male    DOB: [birth year], 63 y.o.   MRN: [id number]  HPI 63 year old Male for follow up of impaired glucose tolerance, hyperlipidemia, hypothyroidism.  Hemoglobin A1c normal at 5.5%, vitamin D level normal at 40, normal lipid panel, normal liver functions, TSH 1.37.  He feels well and looks great.  He is spending a lot of time in [place] with his brother.  He may be moving there permanently in the near future.  He may want to find a primary care physician in that area which is understandable.  Continues weight loss program with Dr. Leafy Flores.  In 2018 he weighed 272 pounds.  Now weighs 240 pounds.  He feels much better.    Review of Systems no new complaints     Objective:   Physical Exam Vital signs above.  Skin warm and dry.  No thyromegaly.  Chest clear.  No carotid bruits.  Cardiac exam regular rate and rhythm normal S1 and S2 without murmurs or gallops.  Extremities without edema.       Assessment & Plan:  Impaired glucose tolerance-stable hemoglobin A1c at 5.5%  History of vitamin D deficiency-vitamin D level is normal  BMI 37-continue to work on diet exercise and weight loss  Hypothyroidism-stable TSH on current dose of thyroid replacement  Hyperlipidemia-stable on simvastatin  BPH treated with Flomax  Plan: We will be happy to see him back at any time or if he prefers he may transfer to primary care physician in [place].

## 2018-11-14 MED ORDER — CILIDINIUM-CHLORDIAZEPOXIDE 2.5-5 MG PO CAPS: ORAL_CAPSULE | ORAL | 3 refills | Status: DC

## 2018-12-06 NOTE — Patient Instructions (Signed)
It truly is a pleasure to have you is a patient.  Keep up the good work.  Continue current medications as previously prescribed.

## 2018-12-07 ENCOUNTER — Other Ambulatory Visit: Payer: Self-pay | Admitting: Internal Medicine

## 2018-12-07 DIAGNOSIS — E559 Vitamin D deficiency, unspecified: Secondary | ICD-10-CM

## 2018-12-09 ENCOUNTER — Telehealth: Payer: Self-pay | Admitting: Internal Medicine

## 2018-12-09 ENCOUNTER — Other Ambulatory Visit: Payer: Self-pay | Admitting: Internal Medicine

## 2018-12-09 ENCOUNTER — Encounter: Payer: Self-pay | Admitting: Internal Medicine

## 2018-12-09 MED ORDER — CILIDINIUM-CHLORDIAZEPOXIDE 2.5-5 MG PO CAPS: ORAL_CAPSULE | ORAL | 2 refills | Status: AC

## 2018-12-09 NOTE — Telephone Encounter (Signed)
Refill Librax to [facility] where pt now resides

## 2018-12-25 ENCOUNTER — Telehealth (INDEPENDENT_AMBULATORY_CARE_PROVIDER_SITE_OTHER): Payer: Self-pay | Admitting: Internal Medicine

## 2018-12-25 ENCOUNTER — Encounter: Payer: Self-pay | Admitting: Internal Medicine

## 2018-12-25 DIAGNOSIS — G43009 Migraine without aura, not intractable, without status migrainosus: Secondary | ICD-10-CM

## 2018-12-25 MED ORDER — HYDROCODONE-ACETAMINOPHEN 10-325 MG PO TABS: 1.0000 | ORAL_TABLET | Freq: Three times a day (TID) | ORAL | 0 refills | Status: AC | PRN

## 2018-12-25 NOTE — Telephone Encounter (Signed)
Refill as requested 

## 2018-12-25 NOTE — Telephone Encounter (Signed)
Nicholas Flores [phone]  Manus called to see if he could get a refill on his Hydrocodone, he only takes it at the beginning of a migraine. I let him know we may need office visit for this especially since it has been so long since he has hade a refill.

## 2018-12-31 ENCOUNTER — Other Ambulatory Visit: Payer: Self-pay | Admitting: Internal Medicine

## 2019-01-04 ENCOUNTER — Other Ambulatory Visit: Payer: Self-pay | Admitting: Internal Medicine

## 2019-01-04 DIAGNOSIS — E559 Vitamin D deficiency, unspecified: Secondary | ICD-10-CM

## 2019-03-10 NOTE — Telephone Encounter (Signed)
error 

## 2019-03-28 ENCOUNTER — Other Ambulatory Visit: Payer: Self-pay | Admitting: Internal Medicine

## 2019-03-28 DIAGNOSIS — E559 Vitamin D deficiency, unspecified: Secondary | ICD-10-CM

## 2019-04-27 ENCOUNTER — Other Ambulatory Visit: Payer: Self-pay | Admitting: Internal Medicine

## 2019-05-03 ENCOUNTER — Other Ambulatory Visit: Payer: Self-pay | Admitting: Internal Medicine

## 2019-05-04 NOTE — Telephone Encounter (Signed)
We got a records transfer request recently. Do we still need to refill these? Please call him

## 2019-05-04 NOTE — Telephone Encounter (Signed)
LVM for patient to call back and confirm if he needs these refills.

## 2019-05-04 NOTE — Telephone Encounter (Signed)
Please refill these x 30 days

## 2019-05-04 NOTE — Telephone Encounter (Signed)
Patient needs a 30 day supply on the 2 prescriptions, his first appointment with new PCP is 05/13/19

## 2019-05-27 ENCOUNTER — Other Ambulatory Visit: Payer: Self-pay | Admitting: Internal Medicine

## 2019-05-27 NOTE — Telephone Encounter (Signed)
We transferred records so do we need to refill this?

## 2019-05-27 NOTE — Telephone Encounter (Signed)
We do not need to refill this, patient has established PCP in [place]. [facility], I let him know we would be refusing to refill. He is good with that.

## 2019-09-16 ENCOUNTER — Other Ambulatory Visit: Payer: Self-pay | Admitting: Internal Medicine

## 2019-09-16 DIAGNOSIS — E559 Vitamin D deficiency, unspecified: Secondary | ICD-10-CM

## 2019-09-16 NOTE — Telephone Encounter (Signed)
I think he now has a physician in [place]. Can you check other recent Rxs and let pharamacy know.

## 2019-09-21 ENCOUNTER — Other Ambulatory Visit: Payer: Self-pay | Admitting: Internal Medicine

## 2019-09-23 NOTE — Telephone Encounter (Signed)
Patient is seeing someone in [place].

## 2021-10-23 ENCOUNTER — Other Ambulatory Visit (HOSPITAL_BASED_OUTPATIENT_CLINIC_OR_DEPARTMENT_OTHER): Payer: Self-pay

## 2021-10-23 MED ORDER — WEGOVY 0.5 MG/0.5ML ~~LOC~~ SOAJ
SUBCUTANEOUS | 0 refills | Status: AC
  Filled 2021-10-23: qty 2, 28d supply, fill #0

## 2021-11-13 ENCOUNTER — Other Ambulatory Visit (HOSPITAL_BASED_OUTPATIENT_CLINIC_OR_DEPARTMENT_OTHER): Payer: Self-pay

## 2021-11-13 MED ORDER — WEGOVY 1 MG/0.5ML ~~LOC~~ SOAJ
SUBCUTANEOUS | 0 refills | Status: AC
  Filled 2021-11-13: qty 2, 28d supply, fill #0

## 2021-11-27 ENCOUNTER — Other Ambulatory Visit (HOSPITAL_BASED_OUTPATIENT_CLINIC_OR_DEPARTMENT_OTHER): Payer: Self-pay

## 2021-11-27 MED ORDER — WEGOVY 0.25 MG/0.5ML ~~LOC~~ SOAJ
SUBCUTANEOUS | 0 refills | Status: AC
  Filled 2021-11-27: qty 2, 28d supply, fill #0

## 2021-11-28 ENCOUNTER — Other Ambulatory Visit (HOSPITAL_BASED_OUTPATIENT_CLINIC_OR_DEPARTMENT_OTHER): Payer: Self-pay

## 2021-12-05 ENCOUNTER — Other Ambulatory Visit (HOSPITAL_BASED_OUTPATIENT_CLINIC_OR_DEPARTMENT_OTHER): Payer: Self-pay

## 2021-12-06 ENCOUNTER — Other Ambulatory Visit (HOSPITAL_BASED_OUTPATIENT_CLINIC_OR_DEPARTMENT_OTHER): Payer: Self-pay
